# Patient Record
Sex: Female | Born: 1950 | Race: White | Hispanic: No | Marital: Married | Smoking: Never smoker
Health system: Southern US, Community
[De-identification: ages and names within clinical notes are randomized; demographics above are authoritative.]

## PROBLEM LIST (undated history)

## (undated) DIAGNOSIS — Z9889 Other specified postprocedural states: Secondary | ICD-10-CM

## (undated) DIAGNOSIS — I493 Ventricular premature depolarization: Secondary | ICD-10-CM

## (undated) DIAGNOSIS — Z87442 Personal history of urinary calculi: Secondary | ICD-10-CM

## (undated) DIAGNOSIS — F32A Depression, unspecified: Secondary | ICD-10-CM

## (undated) DIAGNOSIS — Z8041 Family history of malignant neoplasm of ovary: Secondary | ICD-10-CM

## (undated) DIAGNOSIS — Z800 Family history of malignant neoplasm of digestive organs: Secondary | ICD-10-CM

## (undated) DIAGNOSIS — R112 Nausea with vomiting, unspecified: Secondary | ICD-10-CM

## (undated) DIAGNOSIS — K219 Gastro-esophageal reflux disease without esophagitis: Secondary | ICD-10-CM

## (undated) DIAGNOSIS — K589 Irritable bowel syndrome without diarrhea: Secondary | ICD-10-CM

## (undated) DIAGNOSIS — F329 Major depressive disorder, single episode, unspecified: Secondary | ICD-10-CM

## (undated) DIAGNOSIS — M810 Age-related osteoporosis without current pathological fracture: Secondary | ICD-10-CM

## (undated) DIAGNOSIS — E119 Type 2 diabetes mellitus without complications: Secondary | ICD-10-CM

## (undated) DIAGNOSIS — E785 Hyperlipidemia, unspecified: Secondary | ICD-10-CM

## (undated) DIAGNOSIS — Z8042 Family history of malignant neoplasm of prostate: Secondary | ICD-10-CM

## (undated) DIAGNOSIS — M1990 Unspecified osteoarthritis, unspecified site: Secondary | ICD-10-CM

## (undated) DIAGNOSIS — Z8481 Family history of carrier of genetic disease: Secondary | ICD-10-CM

## (undated) DIAGNOSIS — Z803 Family history of malignant neoplasm of breast: Secondary | ICD-10-CM

## (undated) HISTORY — PX: KNEE ARTHROSCOPY: SUR90

## (undated) HISTORY — DX: Family history of malignant neoplasm of digestive organs: Z80.0

## (undated) HISTORY — DX: Family history of carrier of genetic disease: Z84.81

## (undated) HISTORY — DX: Unspecified osteoarthritis, unspecified site: M19.90

## (undated) HISTORY — DX: Type 2 diabetes mellitus without complications: E11.9

## (undated) HISTORY — DX: Irritable bowel syndrome, unspecified: K58.9

## (undated) HISTORY — DX: Family history of malignant neoplasm of prostate: Z80.42

## (undated) HISTORY — PX: COLONOSCOPY: SHX174

## (undated) HISTORY — DX: Hyperlipidemia, unspecified: E78.5

## (undated) HISTORY — DX: Age-related osteoporosis without current pathological fracture: M81.0

## (undated) HISTORY — PX: ABDOMINAL HYSTERECTOMY: SHX81

## (undated) HISTORY — PX: TONSILLECTOMY: SHX5217

## (undated) HISTORY — DX: Gastro-esophageal reflux disease without esophagitis: K21.9

## (undated) HISTORY — DX: Ventricular premature depolarization: I49.3

## (undated) HISTORY — DX: Family history of malignant neoplasm of breast: Z80.3

## (undated) HISTORY — PX: REDUCTION MAMMAPLASTY: SUR839

## (undated) HISTORY — DX: Family history of malignant neoplasm of ovary: Z80.41

---

## 1998-02-27 ENCOUNTER — Ambulatory Visit (HOSPITAL_BASED_OUTPATIENT_CLINIC_OR_DEPARTMENT_OTHER): Admission: RE | Admit: 1998-02-27 | Discharge: 1998-02-27 | Payer: Self-pay | Admitting: Plastic Surgery

## 1998-09-29 HISTORY — PX: BREAST REDUCTION SURGERY: SHX8

## 1999-07-25 ENCOUNTER — Encounter: Payer: Self-pay | Admitting: Obstetrics and Gynecology

## 1999-07-25 ENCOUNTER — Encounter: Admission: RE | Admit: 1999-07-25 | Discharge: 1999-07-25 | Payer: Self-pay | Admitting: Obstetrics and Gynecology

## 1999-09-18 ENCOUNTER — Emergency Department (HOSPITAL_COMMUNITY): Admission: EM | Admit: 1999-09-18 | Discharge: 1999-09-18 | Payer: Self-pay | Admitting: Emergency Medicine

## 1999-09-18 ENCOUNTER — Encounter: Payer: Self-pay | Admitting: Internal Medicine

## 1999-11-07 ENCOUNTER — Ambulatory Visit (HOSPITAL_COMMUNITY): Admission: RE | Admit: 1999-11-07 | Discharge: 1999-11-07 | Payer: Self-pay | Admitting: Internal Medicine

## 1999-12-27 ENCOUNTER — Encounter (INDEPENDENT_AMBULATORY_CARE_PROVIDER_SITE_OTHER): Payer: Self-pay | Admitting: Specialist

## 1999-12-27 ENCOUNTER — Other Ambulatory Visit: Admission: RE | Admit: 1999-12-27 | Discharge: 1999-12-27 | Payer: Self-pay | Admitting: Gastroenterology

## 2000-04-30 ENCOUNTER — Encounter: Admission: RE | Admit: 2000-04-30 | Discharge: 2000-04-30 | Payer: Self-pay | Admitting: Obstetrics and Gynecology

## 2000-04-30 ENCOUNTER — Encounter: Payer: Self-pay | Admitting: Obstetrics and Gynecology

## 2001-04-19 ENCOUNTER — Encounter (INDEPENDENT_AMBULATORY_CARE_PROVIDER_SITE_OTHER): Payer: Self-pay

## 2001-04-19 ENCOUNTER — Inpatient Hospital Stay (HOSPITAL_COMMUNITY): Admission: RE | Admit: 2001-04-19 | Discharge: 2001-04-22 | Payer: Self-pay | Admitting: Obstetrics and Gynecology

## 2001-05-18 ENCOUNTER — Encounter: Admission: RE | Admit: 2001-05-18 | Discharge: 2001-05-18 | Payer: Self-pay | Admitting: Obstetrics and Gynecology

## 2001-05-18 ENCOUNTER — Encounter: Payer: Self-pay | Admitting: Obstetrics and Gynecology

## 2002-06-21 ENCOUNTER — Encounter: Payer: Self-pay | Admitting: Obstetrics and Gynecology

## 2002-06-21 ENCOUNTER — Encounter: Admission: RE | Admit: 2002-06-21 | Discharge: 2002-06-21 | Payer: Self-pay | Admitting: Internal Medicine

## 2002-06-21 ENCOUNTER — Encounter: Admission: RE | Admit: 2002-06-21 | Discharge: 2002-06-21 | Payer: Self-pay | Admitting: Obstetrics and Gynecology

## 2002-06-21 ENCOUNTER — Encounter: Payer: Self-pay | Admitting: Internal Medicine

## 2002-09-14 ENCOUNTER — Encounter: Payer: Self-pay | Admitting: Internal Medicine

## 2002-09-14 ENCOUNTER — Encounter: Admission: RE | Admit: 2002-09-14 | Discharge: 2002-09-14 | Payer: Self-pay | Admitting: Internal Medicine

## 2003-04-12 ENCOUNTER — Encounter: Admission: RE | Admit: 2003-04-12 | Discharge: 2003-04-12 | Payer: Self-pay | Admitting: Internal Medicine

## 2003-04-12 ENCOUNTER — Encounter: Payer: Self-pay | Admitting: Internal Medicine

## 2003-05-01 ENCOUNTER — Encounter: Payer: Self-pay | Admitting: Internal Medicine

## 2003-05-01 ENCOUNTER — Encounter: Admission: RE | Admit: 2003-05-01 | Discharge: 2003-05-01 | Payer: Self-pay | Admitting: Internal Medicine

## 2003-06-29 ENCOUNTER — Encounter: Payer: Self-pay | Admitting: Internal Medicine

## 2003-06-29 ENCOUNTER — Encounter: Admission: RE | Admit: 2003-06-29 | Discharge: 2003-06-29 | Payer: Self-pay | Admitting: Internal Medicine

## 2004-08-08 ENCOUNTER — Encounter: Admission: RE | Admit: 2004-08-08 | Discharge: 2004-08-08 | Payer: Self-pay | Admitting: Internal Medicine

## 2004-09-18 ENCOUNTER — Emergency Department (HOSPITAL_COMMUNITY): Admission: EM | Admit: 2004-09-18 | Discharge: 2004-09-18 | Payer: Self-pay | Admitting: Emergency Medicine

## 2005-08-05 ENCOUNTER — Ambulatory Visit: Payer: Self-pay | Admitting: Gastroenterology

## 2005-08-06 ENCOUNTER — Encounter (INDEPENDENT_AMBULATORY_CARE_PROVIDER_SITE_OTHER): Payer: Self-pay | Admitting: *Deleted

## 2005-08-06 ENCOUNTER — Ambulatory Visit: Payer: Self-pay | Admitting: Gastroenterology

## 2005-08-11 ENCOUNTER — Encounter: Admission: RE | Admit: 2005-08-11 | Discharge: 2005-08-11 | Payer: Self-pay | Admitting: Internal Medicine

## 2006-08-13 ENCOUNTER — Encounter: Admission: RE | Admit: 2006-08-13 | Discharge: 2006-08-13 | Payer: Self-pay | Admitting: Internal Medicine

## 2007-08-16 ENCOUNTER — Encounter: Admission: RE | Admit: 2007-08-16 | Discharge: 2007-08-16 | Payer: Self-pay | Admitting: Internal Medicine

## 2008-08-16 ENCOUNTER — Encounter: Admission: RE | Admit: 2008-08-16 | Discharge: 2008-08-16 | Payer: Self-pay | Admitting: Internal Medicine

## 2009-08-20 ENCOUNTER — Encounter: Admission: RE | Admit: 2009-08-20 | Discharge: 2009-08-20 | Payer: Self-pay | Admitting: Internal Medicine

## 2010-08-19 ENCOUNTER — Encounter: Admission: RE | Admit: 2010-08-19 | Discharge: 2010-08-19 | Payer: Self-pay | Admitting: Internal Medicine

## 2010-09-29 HISTORY — PX: CARPAL TUNNEL RELEASE: SHX101

## 2010-10-19 ENCOUNTER — Encounter: Payer: Self-pay | Admitting: Internal Medicine

## 2011-01-15 ENCOUNTER — Encounter (HOSPITAL_BASED_OUTPATIENT_CLINIC_OR_DEPARTMENT_OTHER)
Admission: RE | Admit: 2011-01-15 | Discharge: 2011-01-15 | Disposition: A | Discharge status: Home / Self Care | Payer: Managed Care, Other (non HMO) | Source: Ambulatory Visit | Attending: Orthopedic Surgery | Admitting: Orthopedic Surgery

## 2011-01-16 ENCOUNTER — Ambulatory Visit (HOSPITAL_BASED_OUTPATIENT_CLINIC_OR_DEPARTMENT_OTHER)
Admission: RE | Admit: 2011-01-16 | Discharge: 2011-01-16 | Disposition: A | Discharge status: Home / Self Care | Payer: Managed Care, Other (non HMO) | Source: Ambulatory Visit | Attending: Orthopedic Surgery | Admitting: Orthopedic Surgery

## 2011-01-16 DIAGNOSIS — Z01812 Encounter for preprocedural laboratory examination: Secondary | ICD-10-CM | POA: Insufficient documentation

## 2011-01-16 DIAGNOSIS — G5600 Carpal tunnel syndrome, unspecified upper limb: Secondary | ICD-10-CM | POA: Insufficient documentation

## 2011-01-16 DIAGNOSIS — F329 Major depressive disorder, single episode, unspecified: Secondary | ICD-10-CM | POA: Insufficient documentation

## 2011-01-16 DIAGNOSIS — F3289 Other specified depressive episodes: Secondary | ICD-10-CM | POA: Insufficient documentation

## 2011-01-16 LAB — POCT HEMOGLOBIN-HEMACUE: Hemoglobin: 14.9 g/dL (ref 12.0–15.0)

## 2011-01-21 NOTE — Op Note (Signed)
NAME:  Carol Bell, Carol Bell                  ACCOUNT NO.:  [id number]  MEDICAL RECORD NO.:  [id number]          PATIENT TYPE:  LOCATION:                                 FACILITY:  PHYSICIAN:  Katy Fitch. Ab Leaming, M.D.      DATE OF BIRTH:  DATE OF PROCEDURE:  01/16/2011 DATE OF DISCHARGE:                              OPERATIVE REPORT   PREOPERATIVE DIAGNOSIS:  Chronic right carpal tunnel syndrome with positive electrodiagnostic studies demonstrated moderately severe neuropathy.  POSTOPERATIVE DIAGNOSIS:  Chronic right carpal tunnel syndrome with positive electrodiagnostic studies demonstrated moderately severe neuropathy.  OPERATIONS:  Release of right transverse carpal ligament.  OPERATIONS:  Katy Fitch. Jennie Hannay, MD  ASSISTANT:  Annye Rusk PA-C.  ANESTHESIA:  General by LMA.  SUPERVISING ANESTHESIOLOGIST:  Burna Forts, MD  INDICATIONS:  [name] is a 60 year old woman who referred for evaluation and management of hand numbness.  Clinical examination suggested right carpal tunnel syndrome.  Electrodiagnostic confirmed moderately severe right carpal tunnel syndrome with marked prolongation of the motor and sensory latencies.  Due to failed respond to nonoperative measures, she is brought to the operating room at this time for release of her right transverse carpal ligament.  Preoperatively, questions were invited and answered in detail.  PROCEDURE:  Lue Sykora was brought to room 1 of the [facility] and placed in supine position on the operating table.  Following induction of general anesthesia by LMA technique under Dr. Marlane Mingle direct supervision, the right arm was prepped with Betadine soap and solution, sterilely draped.  A pneumatic tourniquet was applied to the proximal right brachium.  Following exsanguination of the right arm with Esmarch bandage, arterial tourniquet was inflated to 220 mmHg.  After routine surgical time-out, procedure commenced  with a short incision in line of the ring finger and the palm.  Subcutaneous tissues were carefully divided revealing the palmar fascia.  This was split longitudinally to reveal the common sensory branch of the median nerve. These were followed back to the transverse carpal ligament, which was gently isolated from the median nerve and ulnar bursa with a Insurance risk surveyor.  Once pathway was created deep to the ligament extending into the distal forearm, scissors were used to release the ligament subcutaneously into the distal forearm.  There was an accessory volar carpal ligament noted at the distal wrist flexion crease that was released with a Therapist, nutritional.  The ulnar bursa was fibrotic and thickened.  No mass or other predicaments were noted.  Bleeding points along the margin of the released ligament were electrocauterized with bipolar current followed by repair of the skin with intradermal 3-0 Prolene suture.  A compressive dressing was applied with a volar plaster splint maintaining the wrist in 5 degrees of dorsiflexion.  For aftercare, Ms. Norment is provided prescription for Percocet 5 mg 1 p.o. q.4-6 h p.r.n. pain, 20 tablets without refill.  We have asked to return for followup in our office in 8 days for dressing change and suture removal.     Katy Fitch. Jovian Lembcke, M.D.     RVS/MEDQ  D:  01/16/2011  T:  01/16/2011  Job:  [id number]  Electronically Signed by Josephine Igo M.D. on 01/21/2011 11:59:50 AM

## 2011-02-14 NOTE — Op Note (Signed)
[facility]  Patient:    Carol Bell, Carol Bell [facility]                       MRN: [id number] Proc. Date: 04/19/01 Adm. Date:  [id number] Attending:  Lendon Colonel                           Operative Report  PREOPERATIVE DIAGNOSES:  Pelvic pain, perfuse periods unresponsive to conservative measures.  POSTOPERATIVE DIAGNOSES:  History of focal endometriosis, small uterine fibroids.  OPERATION PERFORMED:  Total abdominal hysterectomy, bilateral salpingo-oophorectomy.  DESCRIPTION OF PROCEDURE:  The patient was placed in lithotomy position, prepped and draped in the usual fashion. A transverse incision was made in the lower abdomen and after prep and draping a Foley catheter was inserted. The abdomen was entered through the Pfannenstiel incision. Hemostasis was accomplished with the Bovie. Exploration of the upper abdomen revealed a smooth liver and gallbladder. The right kidney was a little bit more irregular than the left but an IVP has been said to be normal. There was on periaortic adenopathy.  The uterus was irregular and had a subserosal fibroid and was soft and boggy consistent with adenomyosis. Both ovaries were normal. The infundibulopelvic ligaments on each side were isolated and ligated with #0 chromic suture as well as the round ligaments on each side. The uterine vessels were skeletonized and ligated. The cardinals and uterosacral ligaments were clamped and ligated with curved Heaney clamps. The angles of the vagina were entered and the specimen was removed from the operative field. The vagina was closed vertically with transverse mattress sutures of #0 chromic and #0 Vicryl. Hemostasis was secured, reperitonealization was accomplished. The uterosacral ligaments were then plicated in the midline for vault support. No unusual blood loss occurred. There was some oozing from both uterosacral ligaments and these were sutured with #0 Vicryl suture.  Reperitonealization accomplished with 3-0 Vicryl. The parietoperitoneum was closed with 2-0 PDS. The fascia was closed with 2-0 PDS and interrupted 2-0 PDS, subcutaneum was closed with one 2-0 PDS and the skin was closed with 4-0 subcuticular PDS. The incision was infiltrated 0.5% Marcaine. The patient tolerated the procedure well. Estimated blood loss was 100-200 cc. DD:  04/19/01 TD:  04/19/01 Job: [id number]

## 2011-02-14 NOTE — Discharge Summary (Signed)
[facility]  Patient:    NELWYN, HEBDON [facility]                       MRN: [id number] Adm. Date:  [id number] Disc. Date: [id number] Attending:  Lendon Colonel                           Discharge Summary  ADMITTING DIAGNOSIS:  Continued pelvic pain.  DISCHARGE DIAGNOSIS:  Continued pelvic pain.  OPERATION:  Total abdominal hysterectomy/bilateral salpingo-oophorectomy.  HISTORY OF PRESENT ILLNESS:  Ms. Bieker is a 60 year old, gravida 2 female who had been followed for lower abdominal pain.  She had a laparoscopy in 1995 with the findings of minimal pelvic endometriosis.  She was treated conservatively with a laser and she has since been on an oral contraceptive therapy for menses control.  However, she continues to have diffuse lower abdominal pain and desires with proceeding with hysterectomy.  She has been evaluated by the urologist for interstitial cystitis and felt to be completely clear in that regard.  Admission lab studies consisted of a hemoglobin and hematocrit.  Hemoglobin was 12.6, hematocrit was 37.  Cardiogram showed a left axis deviation, otherwise within normal limits.  HOSPITAL COURSE:  Patient was admitted to the hospital and underwent an uneventful TAH-BSO.  Pathology report confirmed minimal endometriosis and small uterine fibroids.  Her postoperative course was uncomplicated except for the fact that she was unable to empty her bladder.  Finally, she was kept in the hospital overnight and appeared to be voiding better the next day and was discharged on July 25.  She was asked to return to the office in two weeks. She was given Percocet for pain and hormone replacement therapy.  CONDITION ON DISCHARGE:  Improved. DD:  04/29/01 TD:  04/29/01 Job: [id number]

## 2011-02-14 NOTE — H&P (Signed)
[facility]  Patient:    Carol Bell, Carol Bell                           MRN: [id number] Adm. Date:  04/19/01 Attending:  Katherine Roan, M.D.                         History and Physical  CHIEF COMPLAINT:  Pelvic pain.  HISTORY OF PRESENT ILLNESS:  Ms. Wike is a 60 year old gravida 2, para 2 female who has been followed for low abdominal pain.  She had a laparoscopy in 1995 with findings of minimal pelvic endometriosis.  This was treated conservatively with a laser and she has been followed.  She has been given Ortho-Cyclen for menstrual control, however, she continues to have diffuse lower abdominal pain and is desirous of proceeding with hysterectomy. Prior to that, I have had her evaluated by urologist to rule out interstitial cystitis and that has been done twice and no evidence of endometriosis; she was also cystoscoped at the time of laparoscopy in 1995.  I have reviewed her situation with her husband.  Hysterectomy with bilateral salpingo-oophorectomy are planned.  They understand that no guaranteed relief for pain is made at this time.  CURRENT MEDICATIONS:  Yasmin for menstrual control.  ALLERGIES:  She has no known allergies.  PAST MEDICAL HISTORY:  She has had a history of breast reduction in 1999.  REVIEW OF SYSTEMS:  She wears glasses but has noted no decrease in recent visual or auditory acuity.  No dizziness.  No headaches.  No frequent sore throats.  HEART:  She has no history of hypertension or rheumatic fever.  No chest pain.  No history of mitral valve prolapse.  LUNGS:  No chronic cough, no weight loss, no hemoptysis or asthma.  GU:  She specifically denies incontinence but has been evaluated urologically x 2.  She denies any frequent UTIs.  Her only other problem is intermittent hematuria.  Cystoscopy and upper tracts have been performed and are normal.  GI:  She has no bowel habit change, no weight loss or gain, no diarrhea, no history  of food intolerance. No history of irritable bowel syndrome.  MUSCLES, BONES AND JOINTS:  No fractures or arthritis.  SOCIAL HISTORY:  Works at [employer].  FAMILY HISTORY:  Her mother is 11 and has Alzheimers and is diabetic.  Father died at age 60 from cancer.  She had a sister with cancer of the breast.  Both parents were diabetic.  PHYSICAL EXAMINATION:  VITAL SIGNS:  Examination revealed a weight of 161 pounds, a blood pressure of 120/80.  GENERAL:  Well-developed, nourished female in no acute distress who appears to be her stated age.  HEENT:  Oropharynx is not injected.  The pupils are round and regular and react to light and accommodation.  NECK:  Supple.  Thyroid is not enlarged.  Carotid pulses are equal without bruits.  No adenopathy appreciated.  BREASTS:  No masses or tenderness.  Axillary areas are free from adenopathy.  LUNGS:  Clear to P&A.  Diaphragms move well with inspiration and expiration.  HEART:  Normal sinus rhythm.  No murmurs.  No heaves, thrills, rubs or gallops.  ABDOMEN:  Soft and flat.  Liver, spleen and kidneys are not palpated.  Bowel sounds appear to be normal without bruits.  No guarding.  Liver, spleen and kidneys are not enlarged.  EXTREMITIES:  Good range of motion, equal pulses are reflexes.  PELVIC:  Examination reveals a clean cervix.  Uterus is anterior.  Adnexa negative.  There is tenderness in each adnexa.  IMPRESSION:  Diffuse pelvic pain, unresponsive to conservative therapy.  PLAN:  TAH, BSO.  Detailed informed consent has been given to this patient. DD: 04/17/01 TD:  04/19/01 Job: [id number]

## 2011-07-01 ENCOUNTER — Other Ambulatory Visit: Payer: Self-pay | Admitting: Internal Medicine

## 2011-07-01 DIAGNOSIS — Z1231 Encounter for screening mammogram for malignant neoplasm of breast: Secondary | ICD-10-CM

## 2011-08-25 ENCOUNTER — Other Ambulatory Visit: Payer: Self-pay | Admitting: Internal Medicine

## 2011-08-25 ENCOUNTER — Ambulatory Visit
Admission: RE | Admit: 2011-08-25 | Discharge: 2011-08-25 | Disposition: A | Discharge status: Home / Self Care | Payer: Managed Care, Other (non HMO) | Source: Ambulatory Visit | Attending: Internal Medicine | Admitting: Internal Medicine

## 2011-08-25 DIAGNOSIS — N63 Unspecified lump in unspecified breast: Secondary | ICD-10-CM

## 2011-08-25 DIAGNOSIS — Z1231 Encounter for screening mammogram for malignant neoplasm of breast: Secondary | ICD-10-CM

## 2011-09-01 ENCOUNTER — Ambulatory Visit
Admission: RE | Admit: 2011-09-01 | Discharge: 2011-09-01 | Disposition: A | Discharge status: Home / Self Care | Payer: Managed Care, Other (non HMO) | Source: Ambulatory Visit | Attending: Internal Medicine | Admitting: Internal Medicine

## 2011-09-01 ENCOUNTER — Other Ambulatory Visit: Payer: Self-pay | Admitting: Internal Medicine

## 2011-09-01 DIAGNOSIS — N63 Unspecified lump in unspecified breast: Secondary | ICD-10-CM

## 2011-11-18 ENCOUNTER — Emergency Department (HOSPITAL_COMMUNITY)
Admission: EM | Admit: 2011-11-18 | Discharge: 2011-11-18 | Disposition: A | Discharge status: Home / Self Care | Payer: Managed Care, Other (non HMO) | Source: Home / Self Care | Attending: Emergency Medicine | Admitting: Emergency Medicine

## 2011-11-18 ENCOUNTER — Encounter (HOSPITAL_COMMUNITY): Payer: Self-pay | Admitting: Emergency Medicine

## 2011-11-18 DIAGNOSIS — J019 Acute sinusitis, unspecified: Secondary | ICD-10-CM

## 2011-11-18 HISTORY — DX: Depression, unspecified: F32.A

## 2011-11-18 HISTORY — DX: Major depressive disorder, single episode, unspecified: F32.9

## 2011-11-18 MED ORDER — AMOXICILLIN-POT CLAVULANATE 875-125 MG PO TABS: 1.0000 | ORAL_TABLET | Freq: Two times a day (BID) | ORAL | Status: AC

## 2011-11-18 NOTE — ED Notes (Signed)
C/o uri symptoms onset Friday and have progressively worsened since then.  C/o headache, ears and throat hurt intermittently.  C/o runny nose, cough, and denies fever

## 2011-11-18 NOTE — ED Notes (Signed)
Escorted patient from waiting room to treatment room

## 2011-11-18 NOTE — ED Provider Notes (Signed)
History     CSN: [id number]  Arrival date & time 11/18/11  1121   First MD Initiated Contact with Patient 11/18/11 1314      Chief Complaint  Patient presents with  . URI    (Consider location/radiation/quality/duration/timing/severity/associated sxs/prior treatment) Patient is a 61 y.o. female presenting with URI. The history is provided by the patient. A language interpreter was used.  URI The primary symptoms include ear pain, sore throat and cough. The current episode started today. This is a new problem.  Symptoms associated with the illness include congestion and rhinorrhea. The illness is not associated with chills.  Pt complains of a sorethroat.  Pt reports she has had a headache and sinus congestion.  Pt reports this feels like sinus infections she has had in the past  Past Medical History  Diagnosis Date  . Depression     Past Surgical History  Procedure Date  . Abdominal hysterectomy   . Breast reduction surgery 2000  . Carpal tunnel release 2012    No family history on file.  History  Substance Use Topics  . Smoking status: Never Smoker   . Smokeless tobacco: Not on file  . Alcohol Use: No    OB History    Grav Para Term Preterm Abortions TAB SAB Ect Mult Living                  Review of Systems  Constitutional: Negative for chills.  HENT: Positive for ear pain, congestion, sore throat and rhinorrhea.   Respiratory: Positive for cough.   All other systems reviewed and are negative.    Allergies  Codeine  Home Medications   Current Outpatient Rx  Name Route Sig Dispense Refill  . BUPROPION HCL 100 MG PO TABS Oral Take 300 mg by mouth once.    Marland Kitchen ALKA-SELTZER PLUS COLD PO Oral Take by mouth.    Marland Kitchen EQUATE PO Oral Take by mouth.    . NYQUIL PO Oral Take by mouth.      BP 122/76  Pulse 82  Temp(Src) 97.8 F (36.6 C) (Oral)  Resp 20  SpO2 97%  Physical Exam  Vitals reviewed. Constitutional: She appears well-developed and  well-nourished.  HENT:  Head: Normocephalic and atraumatic.  Right Ear: External ear normal.  Left Ear: External ear normal.       Throat erythematous,  Tender bilat max sinuses  Eyes: Conjunctivae and EOM are normal. Pupils are equal, round, and reactive to light.  Neck: Normal range of motion. Neck supple.  Cardiovascular: Normal rate.   Pulmonary/Chest: Effort normal.  Abdominal: Soft.  Musculoskeletal: Normal range of motion.  Neurological: She is alert.  Skin: Skin is warm.  Psychiatric: She has a normal mood and affect.    ED Course  Procedures (including critical care time)  Labs Reviewed - No data to display No results found.   No diagnosis found.    MDM  rx for augementin,  Follow up with Dr. Neva Seat next week if not improved        Langston Masker, [place] 11/18/11 1331

## 2011-11-18 NOTE — Discharge Instructions (Signed)

## 2011-11-18 NOTE — ED Provider Notes (Signed)
Medical screening examination/treatment/procedure(s) were performed by non-physician practitioner and as supervising physician I was immediately available for consultation/collaboration.  Raynald Blend, MD 11/18/11 1431

## 2012-01-23 ENCOUNTER — Other Ambulatory Visit: Payer: Self-pay | Admitting: Internal Medicine

## 2012-01-23 DIAGNOSIS — N6009 Solitary cyst of unspecified breast: Secondary | ICD-10-CM

## 2012-03-02 ENCOUNTER — Ambulatory Visit
Admission: RE | Admit: 2012-03-02 | Discharge: 2012-03-02 | Disposition: A | Discharge status: Home / Self Care | Payer: Managed Care, Other (non HMO) | Source: Ambulatory Visit | Attending: Internal Medicine | Admitting: Internal Medicine

## 2012-03-02 DIAGNOSIS — N6009 Solitary cyst of unspecified breast: Secondary | ICD-10-CM

## 2012-07-28 ENCOUNTER — Other Ambulatory Visit: Payer: Self-pay | Admitting: Internal Medicine

## 2012-07-28 DIAGNOSIS — N6320 Unspecified lump in the left breast, unspecified quadrant: Secondary | ICD-10-CM

## 2012-08-11 ENCOUNTER — Other Ambulatory Visit: Payer: Self-pay | Admitting: Internal Medicine

## 2012-08-11 ENCOUNTER — Ambulatory Visit
Admission: RE | Admit: 2012-08-11 | Discharge: 2012-08-11 | Disposition: A | Discharge status: Home / Self Care | Payer: Managed Care, Other (non HMO) | Source: Ambulatory Visit | Attending: Internal Medicine | Admitting: Internal Medicine

## 2012-08-11 DIAGNOSIS — R079 Chest pain, unspecified: Secondary | ICD-10-CM

## 2012-09-02 ENCOUNTER — Ambulatory Visit
Admission: RE | Admit: 2012-09-02 | Discharge: 2012-09-02 | Disposition: A | Discharge status: Home / Self Care | Payer: Managed Care, Other (non HMO) | Source: Ambulatory Visit | Attending: Internal Medicine | Admitting: Internal Medicine

## 2012-09-02 ENCOUNTER — Other Ambulatory Visit: Payer: Self-pay | Admitting: Internal Medicine

## 2012-09-02 DIAGNOSIS — N6320 Unspecified lump in the left breast, unspecified quadrant: Secondary | ICD-10-CM

## 2012-09-03 ENCOUNTER — Ambulatory Visit (HOSPITAL_COMMUNITY)
Admission: RE | Admit: 2012-09-03 | Discharge: 2012-09-03 | Disposition: A | Discharge status: Home / Self Care | Payer: Managed Care, Other (non HMO) | Source: Ambulatory Visit | Attending: Internal Medicine | Admitting: Internal Medicine

## 2012-09-03 DIAGNOSIS — R0789 Other chest pain: Secondary | ICD-10-CM | POA: Insufficient documentation

## 2012-09-03 DIAGNOSIS — R6884 Jaw pain: Secondary | ICD-10-CM | POA: Insufficient documentation

## 2012-09-03 DIAGNOSIS — M79609 Pain in unspecified limb: Secondary | ICD-10-CM | POA: Insufficient documentation

## 2012-09-06 ENCOUNTER — Ambulatory Visit
Admission: RE | Admit: 2012-09-06 | Discharge: 2012-09-06 | Disposition: A | Discharge status: Home / Self Care | Payer: Managed Care, Other (non HMO) | Source: Ambulatory Visit | Attending: Internal Medicine | Admitting: Internal Medicine

## 2012-09-06 ENCOUNTER — Other Ambulatory Visit: Payer: Self-pay | Admitting: Internal Medicine

## 2012-09-06 DIAGNOSIS — N6320 Unspecified lump in the left breast, unspecified quadrant: Secondary | ICD-10-CM

## 2012-09-13 ENCOUNTER — Other Ambulatory Visit: Payer: Managed Care, Other (non HMO)

## 2012-10-28 ENCOUNTER — Encounter: Payer: Self-pay | Admitting: Gastroenterology

## 2012-11-01 ENCOUNTER — Encounter: Payer: Self-pay | Admitting: Gastroenterology

## 2012-12-01 ENCOUNTER — Ambulatory Visit (AMBULATORY_SURGERY_CENTER): Payer: Managed Care, Other (non HMO) | Admitting: *Deleted

## 2012-12-01 ENCOUNTER — Encounter: Payer: Self-pay | Admitting: Gastroenterology

## 2012-12-01 VITALS — Ht 69.0 in | Wt 168.6 lb

## 2012-12-01 DIAGNOSIS — Z1211 Encounter for screening for malignant neoplasm of colon: Secondary | ICD-10-CM

## 2012-12-01 MED ORDER — NA SULFATE-K SULFATE-MG SULF 17.5-3.13-1.6 GM/177ML PO SOLN: ORAL | Status: DC

## 2012-12-14 ENCOUNTER — Ambulatory Visit (AMBULATORY_SURGERY_CENTER): Payer: Managed Care, Other (non HMO) | Admitting: Gastroenterology

## 2012-12-14 ENCOUNTER — Encounter: Payer: Self-pay | Admitting: Gastroenterology

## 2012-12-14 VITALS — BP 130/84 | HR 67 | Temp 97.6°F | Resp 15 | Ht 69.0 in | Wt 165.0 lb

## 2012-12-14 MED ORDER — SODIUM CHLORIDE 0.9 % IV SOLN: 500.0000 mL | INTRAVENOUS | Status: DC

## 2012-12-14 NOTE — Op Note (Addendum)
[facility] [address] [place], [address]   COLONOSCOPY PROCEDURE REPORT  PATIENT: Carol Bell, Carol Bell  MR#: [id number] BIRTHDATE: 11/28/1950 , 61  yrs. old GENDER: Female ENDOSCOPIST: Louis Meckel, MD REFERRED XB:JYNWG Chilton Si, M.D. PROCEDURE DATE:  12/14/2012 PROCEDURE:   Colonoscopy, screening ASA CLASS:   Class II INDICATIONS:average risk screening. MEDICATIONS: MAC sedation, administered by CRNA and propofol (Diprivan) 250mg  IV  DESCRIPTION OF PROCEDURE:   After the risks benefits and alternatives of the procedure were thoroughly explained, informed consent was obtained.  A digital rectal exam revealed no abnormalities of the rectum.   The LB [id number]-[id number]  endoscope was introduced through the anus and advanced to the cecum, which was identified by both the appendix and ileocecal valve. No adverse events experienced.   The quality of the prep was Suprep excellent The instrument was then slowly withdrawn as the colon was fully examined.      COLON FINDINGS: A normal appearing cecum, ileocecal valve, and appendiceal orifice were identified.  The ascending, hepatic flexure, transverse, splenic flexure, descending, sigmoid colon and rectum appeared unremarkable.  No polyps or cancers were seen. Retroflexed views revealed no abnormalities. The time to cecum=7 minutes 50 seconds.  Withdrawal time=7 minutes 30 seconds.  The scope was withdrawn and the procedure completed. COMPLICATIONS: There were no complications.  ENDOSCOPIC IMPRESSION: Normal colon  RECOMMENDATIONS: Continue current colorectal screening recommendations for "routine risk" patients with a repeat colonoscopy in 10 years.   eSigned:  Louis Meckel, MD 12/14/2012 9:59 AM Revised: 12/14/2012 9:59 AM  cc:

## 2012-12-14 NOTE — Progress Notes (Signed)
Patient did not experience any of the following events: a burn prior to discharge; a fall within the facility; wrong site/side/patient/procedure/implant event; or a hospital transfer or hospital admission upon discharge from the facility. (G8907) Patient did not have preoperative order for IV antibiotic SSI prophylaxis. (G8918)  

## 2012-12-14 NOTE — Patient Instructions (Addendum)
YOU HAD AN ENDOSCOPIC PROCEDURE TODAY AT THE [facility]: Refer to the procedure report that was given to you for any specific questions about what was found during the examination.  If the procedure report does not answer your questions, please call your gastroenterologist to clarify.  If you requested that your care partner not be given the details of your procedure findings, then the procedure report has been included in a sealed envelope for you to review at your convenience later.  YOU SHOULD EXPECT: Some feelings of bloating in the abdomen. Passage of more gas than usual.  Walking can help get rid of the air that was put into your GI tract during the procedure and reduce the bloating. If you had a lower endoscopy (such as a colonoscopy or flexible sigmoidoscopy) you may notice spotting of blood in your stool or on the toilet paper. If you underwent a bowel prep for your procedure, then you may not have a normal bowel movement for a few days.  DIET: Your first meal following the procedure should be a light meal and then it is ok to progress to your normal diet.  A half-sandwich or bowl of soup is an example of a good first meal.  Heavy or fried foods are harder to digest and may make you feel nauseous or bloated.  Likewise meals heavy in dairy and vegetables can cause extra gas to form and this can also increase the bloating.  Drink plenty of fluids but you should avoid alcoholic beverages for 24 hours.  ACTIVITY: Your care partner should take you home directly after the procedure.  You should plan to take it easy, moving slowly for the rest of the day.  You can resume normal activity the day after the procedure however you should NOT DRIVE or use heavy machinery for 24 hours (because of the sedation medicines used during the test).    SYMPTOMS TO REPORT IMMEDIATELY: A gastroenterologist can be reached at any hour.  During normal business hours, 8:30 AM to 5:00 PM Monday through Friday,  call [phone].  After hours and on weekends, please call the GI answering service at [phone] who will take a message and have the physician on call contact you.   Following lower endoscopy (colonoscopy or flexible sigmoidoscopy):  Excessive amounts of blood in the stool  Significant tenderness or worsening of abdominal pains  Swelling of the abdomen that is new, acute  Fever of 100F or higher    FOLLOW UP: If any biopsies were taken you will be contacted by phone or by letter within the next 1-3 weeks.  Call your gastroenterologist if you have not heard about the biopsies in 3 weeks.  Our staff will call the home number listed on your records the next business day following your procedure to check on you and address any questions or concerns that you may have at that time regarding the information given to you following your procedure. This is a courtesy call and so if there is no answer at the home number and we have not heard from you through the emergency physician on call, we will assume that you have returned to your regular daily activities without incident.  SIGNATURES/CONFIDENTIALITY: You and/or your care partner have signed paperwork which will be entered into your electronic medical record.  These signatures attest to the fact that that the information above on your After Visit Summary has been reviewed and is understood.  Full responsibility of the confidentiality   of this discharge information lies with you and/or your care-partner.     

## 2012-12-15 ENCOUNTER — Telehealth: Payer: Self-pay | Admitting: *Deleted

## 2012-12-15 NOTE — Telephone Encounter (Signed)
  Follow up Call-  Call back number 12/14/2012  Post procedure Call Back phone  # [phone]  Permission to leave phone message Yes     Patient questions:  Do you have a fever, pain , or abdominal swelling? no Pain Score  0 *  Have you tolerated food without any problems? yes  Have you been able to return to your normal activities? yes  Do you have any questions about your discharge instructions: Diet   no Medications  no Follow up visit  no  Do you have questions or concerns about your Care? no  Actions: * If pain score is 4 or above: No action needed, pain <4.

## 2013-08-02 ENCOUNTER — Other Ambulatory Visit: Payer: Self-pay

## 2013-08-02 DIAGNOSIS — Z1231 Encounter for screening mammogram for malignant neoplasm of breast: Secondary | ICD-10-CM

## 2013-09-05 ENCOUNTER — Ambulatory Visit
Admission: RE | Admit: 2013-09-05 | Discharge: 2013-09-05 | Disposition: A | Discharge status: Home / Self Care | Payer: 59 | Source: Ambulatory Visit

## 2013-09-05 DIAGNOSIS — Z1231 Encounter for screening mammogram for malignant neoplasm of breast: Secondary | ICD-10-CM

## 2014-05-31 ENCOUNTER — Encounter: Payer: Self-pay | Admitting: Gastroenterology

## 2014-07-28 ENCOUNTER — Other Ambulatory Visit: Payer: Self-pay

## 2014-07-28 DIAGNOSIS — Z1231 Encounter for screening mammogram for malignant neoplasm of breast: Secondary | ICD-10-CM

## 2014-09-06 ENCOUNTER — Ambulatory Visit
Admission: RE | Admit: 2014-09-06 | Discharge: 2014-09-06 | Disposition: A | Discharge status: Home / Self Care | Payer: Private Health Insurance - Indemnity | Source: Ambulatory Visit

## 2014-09-06 DIAGNOSIS — Z1231 Encounter for screening mammogram for malignant neoplasm of breast: Secondary | ICD-10-CM

## 2015-07-30 ENCOUNTER — Other Ambulatory Visit: Payer: Self-pay

## 2015-07-30 DIAGNOSIS — Z1231 Encounter for screening mammogram for malignant neoplasm of breast: Secondary | ICD-10-CM

## 2015-09-10 ENCOUNTER — Ambulatory Visit
Admission: RE | Admit: 2015-09-10 | Discharge: 2015-09-10 | Disposition: A | Discharge status: Home / Self Care | Payer: PPO | Source: Ambulatory Visit

## 2015-09-10 DIAGNOSIS — Z1231 Encounter for screening mammogram for malignant neoplasm of breast: Secondary | ICD-10-CM

## 2015-10-22 DIAGNOSIS — H01021 Squamous blepharitis right upper eyelid: Secondary | ICD-10-CM | POA: Diagnosis not present

## 2015-10-22 DIAGNOSIS — H01024 Squamous blepharitis left upper eyelid: Secondary | ICD-10-CM | POA: Diagnosis not present

## 2015-10-22 DIAGNOSIS — H00021 Hordeolum internum right upper eyelid: Secondary | ICD-10-CM | POA: Diagnosis not present

## 2015-10-22 DIAGNOSIS — H01022 Squamous blepharitis right lower eyelid: Secondary | ICD-10-CM | POA: Diagnosis not present

## 2015-10-22 DIAGNOSIS — H01025 Squamous blepharitis left lower eyelid: Secondary | ICD-10-CM | POA: Diagnosis not present

## 2015-10-29 DIAGNOSIS — H01025 Squamous blepharitis left lower eyelid: Secondary | ICD-10-CM | POA: Diagnosis not present

## 2015-10-29 DIAGNOSIS — H01024 Squamous blepharitis left upper eyelid: Secondary | ICD-10-CM | POA: Diagnosis not present

## 2015-10-29 DIAGNOSIS — H01022 Squamous blepharitis right lower eyelid: Secondary | ICD-10-CM | POA: Diagnosis not present

## 2015-10-29 DIAGNOSIS — H01021 Squamous blepharitis right upper eyelid: Secondary | ICD-10-CM | POA: Diagnosis not present

## 2015-10-29 DIAGNOSIS — H00021 Hordeolum internum right upper eyelid: Secondary | ICD-10-CM | POA: Diagnosis not present

## 2015-12-20 DIAGNOSIS — M25561 Pain in right knee: Secondary | ICD-10-CM | POA: Diagnosis not present

## 2015-12-20 DIAGNOSIS — M25562 Pain in left knee: Secondary | ICD-10-CM | POA: Diagnosis not present

## 2016-01-25 DIAGNOSIS — Z23 Encounter for immunization: Secondary | ICD-10-CM | POA: Diagnosis not present

## 2016-01-25 DIAGNOSIS — M1990 Unspecified osteoarthritis, unspecified site: Secondary | ICD-10-CM | POA: Diagnosis not present

## 2016-03-03 DIAGNOSIS — N390 Urinary tract infection, site not specified: Secondary | ICD-10-CM | POA: Diagnosis not present

## 2016-04-08 DIAGNOSIS — N3090 Cystitis, unspecified without hematuria: Secondary | ICD-10-CM | POA: Diagnosis not present

## 2016-06-17 DIAGNOSIS — Z23 Encounter for immunization: Secondary | ICD-10-CM | POA: Diagnosis not present

## 2016-07-01 DIAGNOSIS — M1990 Unspecified osteoarthritis, unspecified site: Secondary | ICD-10-CM | POA: Diagnosis not present

## 2016-08-11 ENCOUNTER — Other Ambulatory Visit: Payer: Self-pay | Admitting: Internal Medicine

## 2016-08-11 DIAGNOSIS — Z1231 Encounter for screening mammogram for malignant neoplasm of breast: Secondary | ICD-10-CM

## 2016-08-28 DIAGNOSIS — N200 Calculus of kidney: Secondary | ICD-10-CM | POA: Diagnosis not present

## 2016-08-28 DIAGNOSIS — F419 Anxiety disorder, unspecified: Secondary | ICD-10-CM | POA: Diagnosis not present

## 2016-08-28 DIAGNOSIS — E7800 Pure hypercholesterolemia, unspecified: Secondary | ICD-10-CM | POA: Diagnosis not present

## 2016-08-28 DIAGNOSIS — D559 Anemia due to enzyme disorder, unspecified: Secondary | ICD-10-CM | POA: Diagnosis not present

## 2016-08-28 DIAGNOSIS — M1990 Unspecified osteoarthritis, unspecified site: Secondary | ICD-10-CM | POA: Diagnosis not present

## 2016-08-28 DIAGNOSIS — K219 Gastro-esophageal reflux disease without esophagitis: Secondary | ICD-10-CM | POA: Diagnosis not present

## 2016-08-28 DIAGNOSIS — Z0000 Encounter for general adult medical examination without abnormal findings: Secondary | ICD-10-CM | POA: Diagnosis not present

## 2016-09-01 ENCOUNTER — Other Ambulatory Visit: Payer: Self-pay | Admitting: Internal Medicine

## 2016-09-02 ENCOUNTER — Other Ambulatory Visit: Payer: Self-pay | Admitting: Internal Medicine

## 2016-09-02 DIAGNOSIS — M8580 Other specified disorders of bone density and structure, unspecified site: Secondary | ICD-10-CM

## 2016-09-15 ENCOUNTER — Ambulatory Visit
Admission: RE | Admit: 2016-09-15 | Discharge: 2016-09-15 | Disposition: A | Discharge status: Home / Self Care | Payer: PPO | Source: Ambulatory Visit | Attending: Internal Medicine | Admitting: Internal Medicine

## 2016-09-15 DIAGNOSIS — Z780 Asymptomatic menopausal state: Secondary | ICD-10-CM | POA: Diagnosis not present

## 2016-09-15 DIAGNOSIS — M8580 Other specified disorders of bone density and structure, unspecified site: Secondary | ICD-10-CM

## 2016-09-15 DIAGNOSIS — Z1231 Encounter for screening mammogram for malignant neoplasm of breast: Secondary | ICD-10-CM | POA: Diagnosis not present

## 2016-09-15 DIAGNOSIS — M85852 Other specified disorders of bone density and structure, left thigh: Secondary | ICD-10-CM | POA: Diagnosis not present

## 2017-02-09 DIAGNOSIS — J209 Acute bronchitis, unspecified: Secondary | ICD-10-CM | POA: Diagnosis not present

## 2017-02-09 DIAGNOSIS — L509 Urticaria, unspecified: Secondary | ICD-10-CM | POA: Diagnosis not present

## 2017-02-09 DIAGNOSIS — R0902 Hypoxemia: Secondary | ICD-10-CM | POA: Diagnosis not present

## 2017-02-21 DIAGNOSIS — R109 Unspecified abdominal pain: Secondary | ICD-10-CM | POA: Diagnosis not present

## 2017-02-24 ENCOUNTER — Ambulatory Visit
Admission: RE | Admit: 2017-02-24 | Discharge: 2017-02-24 | Disposition: A | Discharge status: Home / Self Care | Payer: PPO | Source: Ambulatory Visit | Attending: Internal Medicine | Admitting: Internal Medicine

## 2017-02-24 ENCOUNTER — Other Ambulatory Visit: Payer: Self-pay | Admitting: Internal Medicine

## 2017-02-24 DIAGNOSIS — K37 Unspecified appendicitis: Secondary | ICD-10-CM | POA: Diagnosis not present

## 2017-02-24 DIAGNOSIS — R1031 Right lower quadrant pain: Secondary | ICD-10-CM

## 2017-02-24 DIAGNOSIS — R109 Unspecified abdominal pain: Secondary | ICD-10-CM | POA: Diagnosis not present

## 2017-02-24 DIAGNOSIS — R0902 Hypoxemia: Secondary | ICD-10-CM | POA: Diagnosis not present

## 2017-02-24 DIAGNOSIS — J069 Acute upper respiratory infection, unspecified: Secondary | ICD-10-CM | POA: Diagnosis not present

## 2017-02-24 MED ORDER — IOPAMIDOL (ISOVUE-300) INJECTION 61%
100.0000 mL | Freq: Once | INTRAVENOUS | Status: AC | PRN
  Administered 2017-02-24: 100 mL via INTRAVENOUS

## 2017-03-09 DIAGNOSIS — J029 Acute pharyngitis, unspecified: Secondary | ICD-10-CM | POA: Diagnosis not present

## 2017-03-16 DIAGNOSIS — J069 Acute upper respiratory infection, unspecified: Secondary | ICD-10-CM | POA: Diagnosis not present

## 2017-03-16 DIAGNOSIS — R0902 Hypoxemia: Secondary | ICD-10-CM | POA: Diagnosis not present

## 2017-04-13 DIAGNOSIS — H10022 Other mucopurulent conjunctivitis, left eye: Secondary | ICD-10-CM | POA: Diagnosis not present

## 2017-04-21 DIAGNOSIS — H10022 Other mucopurulent conjunctivitis, left eye: Secondary | ICD-10-CM | POA: Diagnosis not present

## 2017-06-16 DIAGNOSIS — Z23 Encounter for immunization: Secondary | ICD-10-CM | POA: Diagnosis not present

## 2017-08-06 ENCOUNTER — Other Ambulatory Visit: Payer: Self-pay | Admitting: Internal Medicine

## 2017-08-06 DIAGNOSIS — Z1231 Encounter for screening mammogram for malignant neoplasm of breast: Secondary | ICD-10-CM

## 2017-08-28 DIAGNOSIS — F419 Anxiety disorder, unspecified: Secondary | ICD-10-CM | POA: Diagnosis not present

## 2017-08-28 DIAGNOSIS — Z6841 Body Mass Index (BMI) 40.0 and over, adult: Secondary | ICD-10-CM | POA: Diagnosis not present

## 2017-08-28 DIAGNOSIS — M8580 Other specified disorders of bone density and structure, unspecified site: Secondary | ICD-10-CM | POA: Diagnosis not present

## 2017-08-28 DIAGNOSIS — Z1211 Encounter for screening for malignant neoplasm of colon: Secondary | ICD-10-CM | POA: Diagnosis not present

## 2017-08-28 DIAGNOSIS — Z1331 Encounter for screening for depression: Secondary | ICD-10-CM | POA: Diagnosis not present

## 2017-08-28 DIAGNOSIS — N200 Calculus of kidney: Secondary | ICD-10-CM | POA: Diagnosis not present

## 2017-08-31 ENCOUNTER — Ambulatory Visit (HOSPITAL_BASED_OUTPATIENT_CLINIC_OR_DEPARTMENT_OTHER): Payer: PPO | Admitting: Genetic Counselor

## 2017-08-31 ENCOUNTER — Encounter: Payer: Self-pay | Admitting: Genetic Counselor

## 2017-08-31 ENCOUNTER — Other Ambulatory Visit: Payer: PPO

## 2017-08-31 DIAGNOSIS — Z8042 Family history of malignant neoplasm of prostate: Secondary | ICD-10-CM | POA: Diagnosis not present

## 2017-08-31 DIAGNOSIS — Z800 Family history of malignant neoplasm of digestive organs: Secondary | ICD-10-CM | POA: Diagnosis not present

## 2017-08-31 DIAGNOSIS — Z8041 Family history of malignant neoplasm of ovary: Secondary | ICD-10-CM | POA: Diagnosis not present

## 2017-08-31 DIAGNOSIS — Z8481 Family history of carrier of genetic disease: Secondary | ICD-10-CM | POA: Diagnosis not present

## 2017-08-31 DIAGNOSIS — Z803 Family history of malignant neoplasm of breast: Secondary | ICD-10-CM | POA: Insufficient documentation

## 2017-08-31 NOTE — Progress Notes (Signed)
REFERRING PROVIDER: Levin Erp, MD [address] [place] [address] [place], [place] [address]  PRIMARY PROVIDER:  Levin Erp, MD  PRIMARY REASON FOR VISIT:  1. Family history of breast cancer   2. Family history of prostate cancer   3. Family history of pancreatic cancer   4. Family history of ovarian cancer   5. Family history of BRCA2 gene positive      HISTORY OF PRESENT ILLNESS:   Carol Bell, a 66 y.o. female, was seen for a [place] cancer genetics consultation at the request of Dr. Nyoka Cowden due to a family history of cancer.  Carol Bell presents to clinic today to discuss the possibility of a hereditary predisposition to cancer, genetic testing, and to further clarify her future cancer risks, as well as potential cancer risks for family members. Carol Bell is a 66 y.o. female with no personal history of cancer.  She reports under going genetic testing in the 1980's at a family reunion that was reportedly negative.  This testing was performed by researchers at [place], [place].  The patient's great niece was diagnosed with both breast and ovarian cancer and was found to carry a BRCA2 mutation.  The niece's father, the patient's nephew, was found to have stage 4 prostate cancer at 17 and also has the BRCA2 mutation.  CANCER HISTORY:   No history exists.     HORMONAL RISK FACTORS:  Menarche was at age 67.  First live birth at age 89.  OCP use for approximately 1-2 years.  Ovaries intact: no.  Hysterectomy: yes.  Menopausal status: postmenopausal.  HRT use: 0 years. Colonoscopy: yes; a couple polyps. Mammogram within the last year: yes. Number of breast biopsies: 0. Up to date with pelvic exams:  No pelvic exams due to previous hysterectomy. Any excessive radiation exposure in the past:  no  Past Medical History:  Diagnosis Date  . Depression   . Family history of BRCA2 gene positive   . Family history of breast cancer   . Family history of ovarian cancer   . Family history of  pancreatic cancer   . Family history of prostate cancer     Past Surgical History:  Procedure Laterality Date  . ABDOMINAL HYSTERECTOMY    . BREAST REDUCTION SURGERY  2000  . CARPAL TUNNEL RELEASE  2012  . KNEE ARTHROSCOPY  10-04-02&07-05-04   right    Social History   Socioeconomic History  . Marital status: Married    Spouse name: Not on file  . Number of children: Not on file  . Years of education: Not on file  . Highest education level: Not on file  Social Needs  . Financial resource strain: Not on file  . Food insecurity - worry: Not on file  . Food insecurity - inability: Not on file  . Transportation needs - medical: Not on file  . Transportation needs - non-medical: Not on file  Occupational History  . Not on file  Tobacco Use  . Smoking status: Never Smoker  . Smokeless tobacco: Never Used  Substance and Sexual Activity  . Alcohol use: No  . Drug use: No  . Sexual activity: Not on file  Other Topics Concern  . Not on file  Social History Narrative  . Not on file     FAMILY HISTORY:  We obtained a detailed, 4-generation family history.  Significant diagnoses are listed below: Family History  Problem Relation Age of Onset  . Dementia Mother   . Breast  cancer Father 61  . Prostate cancer Father 90  . Melanoma Father 35  . Breast cancer Sister 15  . Pancreatic cancer Sister   . Diabetes Brother   . Breast cancer Paternal Aunt   . Brain cancer Paternal Uncle   . Kidney disease Maternal Grandmother   . Melanoma Maternal Grandfather   . Diabetes Paternal Grandmother   . Diabetes Paternal Grandfather   . Stomach cancer Paternal Aunt   . Melanoma Paternal Aunt   . Breast cancer Cousin        3 paternal first cousins  . Bladder Cancer Cousin        paternal first cousin  . Prostate cancer Cousin        paternal first cousin  . Prostate cancer Other 84       metastatic prostate cancer  . Breast cancer Other        great niece  . Ovarian cancer Other    . Colon cancer Neg Hx     The patient has two daughters who are cancer free.  One daughter has undergone genetic testing and the other is in the process of setting up an appointment.  The patient has one brother and one sister.  Her sister was diagnosed with breast cancer in her 58's and pancreatic cancer in her late 28's and died at 68.  This sister had two sons, one who has been diagnosed with prostate cancer.  The patient's brother has diabetes, but no cancer.  Both parents are deceased.  The patient's father was diagnosed with melanoma and breast cancer at age 65 and prostate cancer at 66.  He died at 69.  He had two brothers and five sisters.  One sister had breast cancer and has a son who had bladder cancer and a daughter with breast cancer.  Another sister had stomach cancer and she had one son with prostate cancer.  A third sister did not have cancer but she had a daughter who died of breast cancer. A fourth sister had melanoma, and a 5th sister died in a car accident without a history of cancer.  One brother had a brain cancer and the other brother does not have a history of cancer but has a daughter with breast cancer.  The patient's mother died of complications from dementia.  She had five siblings who had heart disease and dementia, but no cancer.  The maternal grandparents are both deceased.  The grandfather had melanoma and the grandmother had kidney disease.  Patient's maternal ancestors are of English descent, and paternal ancestors are of Caucasian descent. There is no reported Ashkenazi Jewish ancestry. There is no known consanguinity.  GENETIC COUNSELING ASSESSMENT: Carol Bell is a 66 y.o. 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 We, therefore, discussed and recommended the following at today's visit.   DISCUSSION: We discussed that about 5-10% of breast  cancer is hereditary with most cases due to BRCA mutations.  We reviewed the characteristics, features and inheritance patterns of hereditary cancer syndromes. Based on the patient's placement in the family tree, she has an estimated 50% risk of carrying the familial BRCA mutation.  We discussed that at 7, she has out lived some of her risk, and therefore her risk for having a mutation could be lower.  However, she did have a TAH-BSO premenopausally, and therefore could have lowered her  risk for breast cancer.  If the patient is negative, then her daughter, who is in the process of setting up testing, would not need to undergo genetic testing.  IF she is positive, then her daughter would have a 50% risk of carrying this same mutation.  We also discussed genetic testing, including the appropriate family members to test, the process of testing, insurance coverage and turn-around-time for results. We discussed the implications of a negative, positive and/or variant of uncertain significant result. We recommended Carol Bell pursue genetic testing for the familial BRCA2 gene mutation.   Based on Carol Bell's family history of cancer, she meets medical criteria for genetic testing. Her nephew underwent genetic testing and was found to have a BRCA2 mutation.  The testing lab will offer free genetic testing for 90 days after his report to all family members.  The patient falls within this 90 day time frame, and therefore should not have a cost to her testing.  We discussed that some people do not want to undergo genetic testing due to fear of genetic discrimination.  A federal law called the Genetic Information Non-Discrimination Act (GINA) of 2008 helps protect individuals against genetic discrimination based on their genetic test results.  It impacts both health insurance and employment.  With health insurance, it protects against increased premiums, being kicked off insurance or being forced to take a test in order to  be insured.  For employment it protects against hiring, firing and promoting decisions based on genetic test results.  Health status due to a cancer diagnosis is not protected under GINA.    In order to estimate her chance of having a BRCA mutation, we used statistical models (Tyrer Cusik) and laboratory data that take into account her personal medical history, family history and ancestry.  Because each model is different, there can be a lot of variability in the risks they give.  Therefore, these numbers must be considered a rough range and not a precise risk of having a BRCA mutation.  These models estimate that she has approximately a 22.14% -50% chance of having a mutation. Based on this assessment of her family and personal history, genetic testing is recommended.  Based on the patient's personal and family history, statistical models (Tyrer Cusik)  and literature data were used to estimate her risk of developing breast cancer. These estimate her lifetime risk of developing breast cancer to be approximately 20.6%. This estimation does not take into account any genetic testing results.  The patient's lifetime breast cancer risk is a preliminary estimate based on available information using one of several models endorsed by the [place] (ACS). The ACS recommends consideration of breast MRI screening as an adjunct to mammography for patients at high risk (defined as 20% or greater lifetime risk). A more detailed breast cancer risk assessment can be considered, if clinically indicated.   Carol Bell has been determined to be at high risk for breast cancer.  Therefore, we recommend that annual screening with mammography and breast MRI.  We discussed that Carol Bell should discuss her individual situation with her referring physician and determine a breast cancer screening plan with which they are both comfortable.     PLAN: After considering the risks, benefits, and limitations, Carol Bell  provided  informed consent to pursue genetic testing and the blood sample was sent to [facility] for analysis of the BRCA2 gene. Results should be available within approximately 2-3 weeks' time, at which point they will be  disclosed by telephone to Carol Bell, as will any additional recommendations warranted by these results. Carol Bell will receive a summary of her genetic counseling visit and a copy of her results once available. This information will also be available in Epic. We encouraged Carol Bell to remain in contact with cancer genetics annually so that we can continuously update the family history and inform her of any changes in cancer genetics and testing that may be of benefit for her family. Carol Bell questions were answered to her satisfaction today. Our contact information was provided should additional questions or concerns arise.  Lastly, we encouraged Carol Bell to remain in contact with cancer genetics annually so that we can continuously update the family history and inform her of any changes in cancer genetics and testing that may be of benefit for this family.   Ms.  Bell questions were answered to her satisfaction today. Our contact information was provided should additional questions or concerns arise. Thank you for the referral and allowing [place] to share in the care of your patient.   Emidio Warrell P. Florene Glen, [place], [facility] Certified Genetic Counselor [email or web address] phone: [phone]  The patient was seen for a total of 60 minutes in face-to-face genetic counseling.  This patient was discussed with Drs. Magrinat, Lindi Adie and/or Burr Medico who agrees with the above.    _______________________________________________________________________ For Office Staff:  Number of people involved in session: 2 Was an Intern/ student involved with case: no

## 2017-09-09 ENCOUNTER — Encounter: Payer: Self-pay | Admitting: Genetic Counselor

## 2017-09-09 ENCOUNTER — Ambulatory Visit: Payer: Self-pay | Admitting: Genetic Counselor

## 2017-09-09 ENCOUNTER — Telehealth: Payer: Self-pay | Admitting: Genetic Counselor

## 2017-09-09 DIAGNOSIS — Z1379 Encounter for other screening for genetic and chromosomal anomalies: Secondary | ICD-10-CM | POA: Insufficient documentation

## 2017-09-09 DIAGNOSIS — Z8481 Family history of carrier of genetic disease: Secondary | ICD-10-CM

## 2017-09-09 DIAGNOSIS — Z800 Family history of malignant neoplasm of digestive organs: Secondary | ICD-10-CM

## 2017-09-09 DIAGNOSIS — Z8041 Family history of malignant neoplasm of ovary: Secondary | ICD-10-CM

## 2017-09-09 DIAGNOSIS — Z803 Family history of malignant neoplasm of breast: Secondary | ICD-10-CM

## 2017-09-09 DIAGNOSIS — Z8042 Family history of malignant neoplasm of prostate: Secondary | ICD-10-CM

## 2017-09-09 NOTE — Progress Notes (Signed)
HPI: Ms. 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 Please refer to our prior cancer genetics clinic note for more information regarding Ms. Mccants's medical, social and family histories, and our assessment and recommendations, at the time. Ms. Marchesi recent genetic test results were disclosed to her, as were recommendations warranted by these results. These results and recommendations are discussed in more detail below.  CANCER HISTORY:   No history exists.    FAMILY HISTORY:  We obtained a detailed, 4-generation family history.  Significant diagnoses are listed below: Family History  Problem Relation Age of Onset  . Dementia Mother   . Breast cancer Father 43  . Prostate cancer Father 92  . Melanoma Father 12  . Breast cancer Sister 35  . Pancreatic cancer Sister   . Diabetes Brother   . Breast cancer Paternal Aunt   . Brain cancer Paternal Uncle   . Kidney disease Maternal Grandmother   . Melanoma Maternal Grandfather   . Diabetes Paternal Grandmother   . Diabetes Paternal Grandfather   . Stomach cancer Paternal Aunt   . Melanoma Paternal Aunt   . Breast cancer Cousin        3 paternal first cousins  . Bladder Cancer Cousin        paternal first cousin  . Prostate cancer Cousin        paternal first cousin  . Prostate cancer Other 66       metastatic prostate cancer  . Breast cancer Other        great niece  . Ovarian cancer Other   . Colon cancer Neg Hx     The patient has two daughters who are cancer free.  One daughter has undergone genetic testing and the other is in the process of setting up an appointment.  The patient has one brother and one sister.  Her sister was diagnosed with breast cancer in her 41's and pancreatic cancer in her late 81's and died at 61.  This sister had two sons, one who has been  diagnosed with prostate cancer.  The patient's brother has diabetes, but no cancer.  Both parents are deceased.  The patient's father was diagnosed with melanoma and breast cancer at age 3 and prostate cancer at 71.  He died at 24.  He had two brothers and five sisters.  One sister had breast cancer and has a son who had bladder cancer and a daughter with breast cancer.  Another sister had stomach cancer and she had one son with prostate cancer.  A third sister did not have cancer but she had a daughter who died of breast cancer. A fourth sister had melanoma, and a 5th sister died in a car accident without a history of cancer.  One brother had a brain cancer and the other brother does not have a history of cancer but has a daughter with breast cancer.  The patient's mother died of complications from dementia.  She had five siblings who had heart disease and dementia, but no cancer.  The maternal grandparents are both deceased.  The grandfather had melanoma and the grandmother had kidney disease.  Patient's maternal ancestors are of English descent, and paternal ancestors are of Caucasian descent. There is no reported Ashkenazi Jewish ancestry. There is no known consanguinity.  GENETIC TEST RESULTS: We recommended Ms. Birkeland pursue  testing for the familial hereditary cancer gene mutation called BRCA2, [id number] (Splice acceptor). Ms. Ballengee's test was normal and did not reveal the familial mutation. We call this result a true negative result because the cancer-causing mutation was identified in Ms. Harten's family, and she did not inherit it.  Given this negative result, Ms. Frogge's chances of developing BRCA2-related cancers are the same as they are in the general population.    ADDITIONAL GENETIC TESTING: We discussed with Ms. Perezperez that there are other genes that are associated with increased cancer risk that can be analyzed. The laboratories that offer such testing look at these additional genes via a  hereditary cancer gene panel. Should Ms. Mysliwiec wish to pursue additional genetic testing, we are happy to discuss and coordinate this testing, at any time.    CANCER SCREENING RECOMMENDATIONS:  This normal result is reassuring and indicates that Ms. Cashwell does not likely have an increased risk of cancer due to a mutation in one of these genes.  We, therefore, recommended  Ms. Kijowski continue to follow the cancer screening guidelines provided by her primary healthcare providers.   Based on the Ms. Fresquez's personal and family history of cancer, as well as her genetic test results, statistical models (Tyrer Cusik)  and literature data were used to estimate her risk of developing breast cancer. These estimate her lifetime risk of developing breast cancer to be approximately 11.9%.  The patient's lifetime breast cancer risk is a preliminary estimate based on available information using one of several models endorsed by the [place] (ACS). The ACS recommends consideration of breast MRI screening as an adjunct to mammography for patients at high risk (defined as 20% or greater lifetime risk). Based on this risk, a breast MRI would not be warranted.     RECOMMENDATIONS FOR FAMILY MEMBERS: Women in this family might be at some increased risk of developing cancer, over the general population risk, simply due to the family history of cancer. We recommended women in this family have a yearly mammogram beginning at age 75, or 75 years younger than the earliest onset of cancer, an annual clinical breast exam, and perform monthly breast self-exams. Women in this family should also have a gynecological exam as recommended by their primary provider. All family members should have a colonoscopy by age 84.  FOLLOW-UP: Lastly, we discussed with Ms. Paradis that cancer genetics is a rapidly advancing field and it is possible that new genetic tests will be appropriate for her and/or her family members in the future.  We encouraged her to remain in contact with cancer genetics on an annual basis so we can update her personal and family histories and let her know of advances in cancer genetics that may benefit this family.   Our contact number was provided. Ms. Gwinner questions were answered to her satisfaction, and she knows she is welcome to call us at anytime with additional questions or concerns.   Roma Kayser, MS, [facility] Certified Genetic Counselor [email or web address]

## 2017-09-09 NOTE — Telephone Encounter (Signed)
Revealed that genetic testing did not find the familial pathogenic BRCA2 mutation that was identified in her nephew.  Since the patient does not have this mutation, her daughters do not need to undergo genetic testing for the familial mutation.

## 2017-09-10 DIAGNOSIS — M1711 Unilateral primary osteoarthritis, right knee: Secondary | ICD-10-CM | POA: Diagnosis not present

## 2017-09-17 ENCOUNTER — Ambulatory Visit: Payer: PPO

## 2017-10-09 ENCOUNTER — Ambulatory Visit
Admission: RE | Admit: 2017-10-09 | Discharge: 2017-10-09 | Disposition: A | Discharge status: Home / Self Care | Payer: PPO | Source: Ambulatory Visit | Attending: Internal Medicine | Admitting: Internal Medicine

## 2017-10-09 DIAGNOSIS — Z1231 Encounter for screening mammogram for malignant neoplasm of breast: Secondary | ICD-10-CM | POA: Diagnosis not present

## 2017-10-18 ENCOUNTER — Encounter (HOSPITAL_COMMUNITY): Payer: Self-pay | Admitting: *Deleted

## 2017-10-18 ENCOUNTER — Other Ambulatory Visit: Payer: Self-pay

## 2017-10-18 ENCOUNTER — Ambulatory Visit (HOSPITAL_COMMUNITY)
Admission: EM | Admit: 2017-10-18 | Discharge: 2017-10-18 | Disposition: A | Discharge status: Home / Self Care | Payer: PPO | Attending: Internal Medicine | Admitting: Internal Medicine

## 2017-10-18 DIAGNOSIS — J029 Acute pharyngitis, unspecified: Secondary | ICD-10-CM | POA: Diagnosis not present

## 2017-10-18 DIAGNOSIS — J020 Streptococcal pharyngitis: Secondary | ICD-10-CM

## 2017-10-18 DIAGNOSIS — R1110 Vomiting, unspecified: Secondary | ICD-10-CM

## 2017-10-18 LAB — POCT RAPID STREP A: Streptococcus, Group A Screen (Direct): POSITIVE — AB

## 2017-10-18 MED ORDER — ONDANSETRON 4 MG PO TBDP: 4.0000 mg | ORAL_TABLET | Freq: Three times a day (TID) | ORAL | 0 refills | Status: DC | PRN

## 2017-10-18 MED ORDER — PENICILLIN G BENZATHINE 1200000 UNIT/2ML IM SUSP
1.2000 10*6.[IU] | Freq: Once | INTRAMUSCULAR | Status: AC
  Administered 2017-10-18: 1.2 10*6.[IU] via INTRAMUSCULAR

## 2017-10-18 MED ORDER — PENICILLIN G BENZATHINE 1200000 UNIT/2ML IM SUSP
INTRAMUSCULAR | Status: AC
  Filled 2017-10-18: qty 2

## 2017-10-18 NOTE — Discharge Instructions (Signed)
Penicillin injection in office today for strep throat.  I have called in some Zofran here pharmacy, you can take if continued to have nausea or vomiting.  Continue Tylenol/Motrin for pain and fever.  Monitor for any worsening of symptoms, trouble breathing, trouble swallowing, swelling of the throat, follow up here or at the emergency department for reevaluation.

## 2017-10-18 NOTE — ED Triage Notes (Addendum)
Fever, sore throat, chills, headaches

## 2017-10-18 NOTE — ED Provider Notes (Signed)
[place]    CSN: [id number] Arrival date & time: 10/18/17  1707     History   Chief Complaint Chief Complaint  Patient presents with  . Sore Throat    HPI Carol Bell is a 67 y.o. female.   67 year old female comes in for 2-day history of sore throat.  Fever, T-max 102, ibuprofen 5 hours ago.  She has had 2 episodes of vomiting, denies current nausea.  She denies cough, congestion, rhinorrhea.  She has had exposure to strep.  Denies chest pain, shortness of breath, wheezing.  Never smoker.      Past Medical History:  Diagnosis Date  . Depression   . Family history of BRCA2 gene positive   . Family history of breast cancer   . Family history of ovarian cancer   . Family history of pancreatic cancer   . Family history of prostate cancer     Patient Active Problem List   Diagnosis Date Noted  . Genetic testing 09/09/2017  . Family history of breast cancer   . Family history of prostate cancer   . Family history of pancreatic cancer   . Family history of ovarian cancer   . Family history of BRCA2 gene positive     Past Surgical History:  Procedure Laterality Date  . ABDOMINAL HYSTERECTOMY    . BREAST REDUCTION SURGERY  2000  . CARPAL TUNNEL RELEASE  2012  . KNEE ARTHROSCOPY  10-04-02&07-05-04   right  . REDUCTION MAMMAPLASTY      OB History    No data available       Home Medications    Prior to Admission medications   Medication Sig Start Date End Date Taking? Authorizing Provider  buPROPion (WELLBUTRIN) 100 MG tablet Take 300 mg by mouth once.   Yes [provider]  GRAPE SEED EXTRACT PO Take 1 tablet by mouth daily.   Yes [provider]  Chlorphen-Phenyleph-ASA (ALKA-SELTZER PLUS COLD PO) Take by mouth.    [provider]  ondansetron (ZOFRAN ODT) 4 MG disintegrating tablet Take 1 tablet (4 mg total) by mouth every 8 (eight) hours as needed for nausea or vomiting. 10/18/17   Tasia Catchings, Amy V, PA-C    Pseudoeph-Doxylamine-DM-APAP (NYQUIL PO) Take by mouth.    [provider]    Family History Family History  Problem Relation Age of Onset  . Dementia Mother   . Breast cancer Father 48  . Prostate cancer Father 55  . Melanoma Father 51  . Breast cancer Sister 80  . Pancreatic cancer Sister   . Diabetes Brother   . Breast cancer Paternal Beryle Lathe of age of onset  . Brain cancer Paternal Uncle   . Kidney disease Maternal Grandmother   . Melanoma Maternal Grandfather   . Diabetes Paternal Grandmother   . Diabetes Paternal Grandfather   . Stomach cancer Paternal Aunt   . Melanoma Paternal Aunt   . Breast cancer Cousin        3 paternal first cousins, ? onset  . Bladder Cancer Cousin        paternal first cousin  . Prostate cancer Cousin        paternal first cousin  . Prostate cancer Other 61       metastatic prostate cancer  . Breast cancer Other        great niece, ? onset  . Ovarian cancer Other   . Colon cancer Neg  Hx     Social History Social History   Tobacco Use  . Smoking status: Never Smoker  . Smokeless tobacco: Never Used  Substance Use Topics  . Alcohol use: No  . Drug use: No     Allergies   Codeine   Review of Systems Review of Systems  Reason unable to perform ROS: See HPI as above.     Physical Exam Triage Vital Signs ED Triage Vitals  Enc Vitals Group     BP 10/18/17 1816 92/62     Pulse Rate 10/18/17 1816 81     Resp --      Temp 10/18/17 1816 98.5 F (36.9 C)     Temp Source 10/18/17 1816 Oral     SpO2 10/18/17 1816 98 %     Weight --      Height --      Head Circumference --      Peak Flow --      Pain Score 10/18/17 1812 7     Pain Loc --      Pain Edu? --      Excl. in [place]? --    No data found.  Updated Vital Signs BP 92/62 (BP Location: Left Arm)   Pulse 81   Temp 98.5 F (36.9 C) (Oral)   SpO2 98%   Physical Exam  Constitutional: She is oriented to person, place, and time. She appears  well-developed and well-nourished. No distress.  HENT:  Head: Normocephalic and atraumatic.  Right Ear: Tympanic membrane, external ear and ear canal normal. Tympanic membrane is not erythematous and not bulging.  Left Ear: Tympanic membrane, external ear and ear canal normal. Tympanic membrane is not erythematous and not bulging.  Nose: Nose normal. Right sinus exhibits no maxillary sinus tenderness and no frontal sinus tenderness. Left sinus exhibits no maxillary sinus tenderness and no frontal sinus tenderness.  Mouth/Throat: Uvula is midline and mucous membranes are normal. Posterior oropharyngeal erythema present. Tonsils are 1+ on the right. Tonsils are 1+ on the left. No tonsillar exudate.  Eyes: Conjunctivae are normal. Pupils are equal, round, and reactive to light.  Neck: Normal range of motion. Neck supple.  Cardiovascular: Normal rate, regular rhythm and normal heart sounds. Exam reveals no gallop and no friction rub.  No murmur heard. Pulmonary/Chest: Effort normal and breath sounds normal. She has no decreased breath sounds. She has no wheezes. She has no rhonchi. She has no rales.  Lymphadenopathy:    She has no cervical adenopathy.  Neurological: She is alert and oriented to person, place, and time.  Skin: Skin is warm and dry.  Psychiatric: She has a normal mood and affect. Her behavior is normal. Judgment normal.     UC Treatments / Results  Labs (all labs ordered are listed, but only abnormal results are displayed) Labs Reviewed  POCT RAPID STREP A - Abnormal; Notable for the following components:      Result Value   Streptococcus, Group A Screen (Direct) POSITIVE (*)    All other components within normal limits    EKG  EKG Interpretation None       Radiology No results found.  Procedures Procedures (including critical care time)  Medications Ordered in UC Medications  penicillin g benzathine (BICILLIN LA) 1200000 UNIT/2ML injection 1.2 Million Units  (1.2 Million Units Intramuscular Given 10/18/17 1907)     Initial Impression / Assessment and Plan / UC Course  I have reviewed the triage vital signs and the nursing  notes.  Pertinent labs & imaging results that were available during my care of the patient were reviewed by me and considered in my medical decision making (see chart for details).    Rapid strep positive.  Patient prefers Bicillin IM, given in office.  Zofran called to pharmacy, can fill if continue with nausea/vomiting.  Push fluids.  Return precautions given.  Patient expresses understanding and agrees to plan.  Final Clinical Impressions(s) / UC Diagnoses   Final diagnoses:  Strep pharyngitis    ED Discharge Orders        Ordered    ondansetron (ZOFRAN ODT) 4 MG disintegrating tablet  Every 8 hours PRN     10/18/17 1855        Ok Edwards, PA-C 10/18/17 1936

## 2017-10-22 DIAGNOSIS — M1711 Unilateral primary osteoarthritis, right knee: Secondary | ICD-10-CM | POA: Diagnosis not present

## 2018-01-27 DIAGNOSIS — H01022 Squamous blepharitis right lower eyelid: Secondary | ICD-10-CM | POA: Diagnosis not present

## 2018-01-27 DIAGNOSIS — H0014 Chalazion left upper eyelid: Secondary | ICD-10-CM | POA: Diagnosis not present

## 2018-01-27 DIAGNOSIS — G43109 Migraine with aura, not intractable, without status migrainosus: Secondary | ICD-10-CM | POA: Diagnosis not present

## 2018-01-27 DIAGNOSIS — H01021 Squamous blepharitis right upper eyelid: Secondary | ICD-10-CM | POA: Diagnosis not present

## 2018-01-27 DIAGNOSIS — H01024 Squamous blepharitis left upper eyelid: Secondary | ICD-10-CM | POA: Diagnosis not present

## 2018-01-27 DIAGNOSIS — H01025 Squamous blepharitis left lower eyelid: Secondary | ICD-10-CM | POA: Diagnosis not present

## 2018-01-27 DIAGNOSIS — H43811 Vitreous degeneration, right eye: Secondary | ICD-10-CM | POA: Diagnosis not present

## 2018-01-27 DIAGNOSIS — H2513 Age-related nuclear cataract, bilateral: Secondary | ICD-10-CM | POA: Diagnosis not present

## 2018-03-15 DIAGNOSIS — M1711 Unilateral primary osteoarthritis, right knee: Secondary | ICD-10-CM | POA: Diagnosis not present

## 2018-05-04 DIAGNOSIS — M1711 Unilateral primary osteoarthritis, right knee: Secondary | ICD-10-CM | POA: Diagnosis not present

## 2018-05-27 DIAGNOSIS — N390 Urinary tract infection, site not specified: Secondary | ICD-10-CM | POA: Diagnosis not present

## 2018-06-02 DIAGNOSIS — M1711 Unilateral primary osteoarthritis, right knee: Secondary | ICD-10-CM | POA: Diagnosis not present

## 2018-06-02 NOTE — Pre-Procedure Instructions (Signed)
[facility]  06/02/2018      [facility] - [place] - [address] [address] [place] [address] Phone: [phone] Fax: [phone]    Your procedure is scheduled on Friday September 16th.  Report to [facility] Admitting at 0530 A.M.  Call this number if you have problems the morning of surgery:  [phone]   Remember:  Do not eat or drink after midnight.    Take these medicines the morning of surgery with A SIP OF WATER   Tylenol (if needed)  Wellbutrin  Eye drops (if needed)  Prilosec    7 days prior to surgery STOP taking any Aspirin(unless otherwise instructed by your surgeon), Aleve, Naproxen, Ibuprofen, Motrin, Advil, Goody's, BC's, all herbal medications, fish oil, and all vitamins     Do not wear jewelry, make-up or nail polish.  Do not wear lotions, powders, or perfumes, or deodorant.  Do not shave 48 hours prior to surgery.  Men may shave face and neck.  Do not bring valuables to the hospital.  [facility] is not responsible for any belongings or valuables.  Contacts, dentures or bridgework may not be worn into surgery.  Leave your suitcase in the car.  After surgery it may be brought to your room.  For patients admitted to the hospital, discharge time will be determined by your treatment team.  Patients discharged the day of surgery will not be allowed to drive home.    [place]- Preparing For Surgery  Before surgery, you can play an important role. Because skin is not sterile, your skin needs to be as free of germs as possible. You can reduce the number of germs on your skin by washing with CHG (chlorahexidine gluconate) Soap before surgery.  CHG is an antiseptic cleaner which kills germs and bonds with the skin to continue killing germs even after washing.    Oral Hygiene is also important to reduce your risk of infection.  Remember - BRUSH YOUR TEETH THE MORNING OF SURGERY WITH  YOUR REGULAR TOOTHPASTE  Please do not use if you have an allergy to CHG or antibacterial soaps. If your skin becomes reddened/irritated stop using the CHG.  Do not shave (including legs and underarms) for at least 48 hours prior to first CHG shower. It is OK to shave your face.  Please follow these instructions carefully.   1. Shower the NIGHT BEFORE SURGERY and the MORNING OF SURGERY with CHG.   2. If you chose to wash your hair, wash your hair first as usual with your normal shampoo.  3. After you shampoo, rinse your hair and body thoroughly to remove the shampoo.  4. Use CHG as you would any other liquid soap. You can apply CHG directly to the skin and wash gently with a scrungie or a clean washcloth.   5. Apply the CHG Soap to your body ONLY FROM THE NECK DOWN.  Do not use on open wounds or open sores. Avoid contact with your eyes, ears, mouth and genitals (private parts). Wash Face and genitals (private parts)  with your normal soap.  6. Wash thoroughly, paying special attention to the area where your surgery will be performed.  7. Thoroughly rinse your body with warm water from the neck down.  8. DO NOT shower/wash with your normal soap after using and rinsing off the CHG Soap.  9. Pat yourself dry with a CLEAN TOWEL.  10.  Wear CLEAN PAJAMAS to bed the night before surgery, wear comfortable clothes the morning of surgery  11. Place CLEAN SHEETS on your bed the night of your first shower and DO NOT SLEEP WITH PETS.    Day of Surgery:  Do not apply any deodorants/lotions.  Please wear clean clothes to the hospital/surgery center.   Remember to brush your teeth WITH YOUR REGULAR TOOTHPASTE.    Please read over the following fact sheets that you were given.

## 2018-06-02 NOTE — H&P (Signed)
TOTAL KNEE ADMISSION H&P  Patient is being admitted for right total knee arthroplasty.  Subjective:  Chief Complaint:right knee pain.  HPI: Carol Bell, 67 y.o. female, has a history of pain and functional disability in the right knee due to arthritis and has failed non-surgical conservative treatments for greater than 12 weeks to includeNSAID's and/or analgesics, corticosteriod injections, viscosupplementation injections, flexibility and strengthening excercises, supervised PT with diminished ADL's post treatment, weight reduction as appropriate and activity modification.  Onset of symptoms was gradual, starting 10 years ago with gradually worsening course since that time. The patient noted prior procedures on the knee to include  arthroscopy and menisectomy on the right knee(s).  Patient currently rates pain in the right knee(s) at 10 out of 10 with activity. Patient has night pain, worsening of pain with activity and weight bearing and pain that interferes with activities of daily living.  Patient has evidence of subchondral sclerosis, periarticular osteophytes and joint space narrowing by imaging studies.  There is no active infection.  Patient Active Problem List   Diagnosis Date Noted  . Genetic testing 09/09/2017  . Family history of breast cancer   . Family history of prostate cancer   . Family history of pancreatic cancer   . Family history of ovarian cancer   . Family history of BRCA2 gene positive    Past Medical History:  Diagnosis Date  . Depression   . Family history of BRCA2 gene positive   . Family history of breast cancer   . Family history of ovarian cancer   . Family history of pancreatic cancer   . Family history of prostate cancer     Past Surgical History:  Procedure Laterality Date  . ABDOMINAL HYSTERECTOMY    . BREAST REDUCTION SURGERY  2000  . CARPAL TUNNEL RELEASE  2012  . KNEE ARTHROSCOPY  10-04-02&07-05-04   right  . REDUCTION MAMMAPLASTY      No  current facility-administered medications for this encounter.    Current Outpatient Medications  Medication Sig Dispense Refill Last Dose  . acetaminophen (TYLENOL) 500 MG tablet Take 1,000 mg by mouth every 4 (four) hours as needed for moderate pain.     Marland Kitchen buPROPion (WELLBUTRIN XL) 300 MG 24 hr tablet Take 300 mg by mouth daily.     . Calcium Carbonate-Vitamin D (CALCIUM 600+D PO) Take 1 tablet by mouth 2 (two) times daily.     . Carboxymethylcellul-Glycerin (LUBRICATING EYE DROPS OP) Place 1 drop into both eyes daily as needed (dry eyes).     Marland Kitchen diclofenac sodium (VOLTAREN) 1 % GEL Apply 1 application topically 4 (four) times daily as needed (pain).     . Glucosamine HCl-MSM (GLUCOSAMINE-MSM PO) Take 1 tablet by mouth 2 (two) times daily.     Marland Kitchen GRAPE SEED EXTRACT PO Take 1 tablet by mouth daily.   10/18/2017 at Unknown time  . ibuprofen (ADVIL,MOTRIN) 200 MG tablet Take 400 mg by mouth every 4 (four) hours as needed for headache or moderate pain.     Marland Kitchen MELATONIN-THEANINE PO Take 1 tablet by mouth at bedtime as needed (sleep).     . Multiple Vitamin (MULTIVITAMIN WITH MINERALS) TABS tablet Take 1 tablet by mouth daily.     . Omega-3 Fatty Acids (FISH OIL) 1000 MG CAPS Take 1,000 mg by mouth daily.     Marland Kitchen omeprazole (PRILOSEC) 20 MG capsule Take 20 mg by mouth daily.      Allergies  Allergen Reactions  . Codeine  Nausea Only  . Other Nausea And Vomiting    general anesthesia     Social History   Tobacco Use  . Smoking status: Never Smoker  . Smokeless tobacco: Never Used  Substance Use Topics  . Alcohol use: No    Family History  Problem Relation Age of Onset  . Dementia Mother   . Breast cancer Father 39  . Prostate cancer Father 74  . Melanoma Father 34  . Breast cancer Sister 63  . Pancreatic cancer Sister   . Diabetes Brother   . Breast cancer Paternal Beryle Lathe of age of onset  . Brain cancer Paternal Uncle   . Kidney disease Maternal Grandmother   . Melanoma  Maternal Grandfather   . Diabetes Paternal Grandmother   . Diabetes Paternal Grandfather   . Stomach cancer Paternal Aunt   . Melanoma Paternal Aunt   . Breast cancer Cousin        3 paternal first cousins, ? onset  . Bladder Cancer Cousin        paternal first cousin  . Prostate cancer Cousin        paternal first cousin  . Prostate cancer Other 73       metastatic prostate cancer  . Breast cancer Other        great niece, ? onset  . Ovarian cancer Other   . Colon cancer Neg Hx      Review of Systems  Constitutional: Negative.   HENT: Negative.   Eyes: Negative.   Respiratory: Negative.   Cardiovascular: Negative.   Gastrointestinal: Negative.   Genitourinary: Negative.   Musculoskeletal: Positive for back pain and joint pain.  Skin: Negative.   Neurological: Negative.   Endo/Heme/Allergies: Negative.   Psychiatric/Behavioral: Negative.     Objective:  Physical Exam  Constitutional: She is oriented to person, place, and time. She appears well-developed and well-nourished.  HENT:  Head: Normocephalic and atraumatic.  Mouth/Throat: Oropharynx is clear and moist.  Eyes: Pupils are equal, round, and reactive to light. Conjunctivae are normal.  Neck: Neck supple.  Cardiovascular: Normal rate and regular rhythm.  Respiratory: Effort normal and breath sounds normal.  GI: Soft. Bowel sounds are normal.  Musculoskeletal:   Examination of her right knee reveals moderate valgus deformity.  1+ synovitis.  Full range of motion.  Knee is stable.  Examination of her left knee reveals full range of motion without pain, swelling, weakness or instability.  Vascular exam: Pulses are 2+ and symmetric.  Neurologic exam: Distal motor and sensory examination is within normal limits.    Neurological: She is alert and oriented to person, place, and time.  Skin: Skin is warm and dry.  Psychiatric: She has a normal mood and affect. Her behavior is normal.    Vital signs in last 24  hours: Temp:  [97.7 F (36.5 C)] 97.7 F (36.5 C) (09/04 1500) Pulse Rate:  [80] 80 (09/04 1500) BP: (116)/(69) 116/69 (09/04 1500) SpO2:  [98 %] 98 % (09/04 1500) Weight:  [73.9 kg] 73.9 kg (09/04 1500)  Labs:   Estimated body mass index is 24.78 kg/m as calculated from the following:   Height as of this encounter: '5\' 8"'  (1.727 m).   Weight as of this encounter: 73.9 kg.   Imaging Review Plain radiographs demonstrate severe degenerative joint disease of the right knee(s). The overall alignment issignificant valgus. The bone quality appears to be good for age and reported activity level.  Preoperative templating of the joint replacement has been completed, documented, and submitted to the Operating Room personnel in order to optimize intra-operative equipment management.   Anticipated LOS equal to or greater than 2 midnights due to - Age 29 and older with one or more of the following:  - Obesity  - Expected need for hospital services (PT, OT, Nursing) required for safe  discharge  - Anticipated need for postoperative skilled nursing care or inpatient rehab  - Active co-morbidities: None OR   - Unanticipated findings during/Post Surgery: None  - Patient is a high risk of re-admission due to: None     Assessment/Plan:  End stage arthritis, right knee   The patient history, physical examination, clinical judgment of the provider and imaging studies are consistent with end stage degenerative joint disease of the right knee(s) and total knee arthroplasty is deemed medically necessary. The treatment options including medical management, injection therapy arthroscopy and arthroplasty were discussed at length. The risks and benefits of total knee arthroplasty were presented and reviewed. The risks due to aseptic loosening, infection, stiffness, patella tracking problems, thromboembolic complications and other imponderables were discussed. The patient acknowledged the explanation,  agreed to proceed with the plan and consent was signed. Patient is being admitted for inpatient treatment for surgery, pain control, PT, OT, prophylactic antibiotics, VTE prophylaxis, progressive ambulation and ADL's and discharge planning. The patient is planning to be discharged home with home health services   This patient is precerted as INPATIENT

## 2018-06-03 ENCOUNTER — Encounter (HOSPITAL_COMMUNITY): Payer: Self-pay

## 2018-06-03 ENCOUNTER — Encounter (HOSPITAL_COMMUNITY)
Admission: RE | Admit: 2018-06-03 | Discharge: 2018-06-03 | Disposition: A | Discharge status: Home / Self Care | Payer: PPO | Source: Ambulatory Visit | Attending: Orthopedic Surgery | Admitting: Orthopedic Surgery

## 2018-06-03 DIAGNOSIS — Z01812 Encounter for preprocedural laboratory examination: Secondary | ICD-10-CM | POA: Insufficient documentation

## 2018-06-03 DIAGNOSIS — M1711 Unilateral primary osteoarthritis, right knee: Secondary | ICD-10-CM | POA: Diagnosis not present

## 2018-06-03 DIAGNOSIS — Z01810 Encounter for preprocedural cardiovascular examination: Secondary | ICD-10-CM | POA: Insufficient documentation

## 2018-06-03 DIAGNOSIS — I4510 Unspecified right bundle-branch block: Secondary | ICD-10-CM | POA: Insufficient documentation

## 2018-06-03 HISTORY — DX: Other specified postprocedural states: Z98.890

## 2018-06-03 HISTORY — DX: Personal history of urinary calculi: Z87.442

## 2018-06-03 HISTORY — DX: Nausea with vomiting, unspecified: R11.2

## 2018-06-03 LAB — COMPREHENSIVE METABOLIC PANEL
ALT: 33 U/L (ref 0–44)
ANION GAP: 11 (ref 5–15)
AST: 30 U/L (ref 15–41)
Albumin: 3.8 g/dL (ref 3.5–5.0)
Alkaline Phosphatase: 64 U/L (ref 38–126)
BILIRUBIN TOTAL: 0.8 mg/dL (ref 0.3–1.2)
BUN: 15 mg/dL (ref 8–23)
CALCIUM: 9.4 mg/dL (ref 8.9–10.3)
CO2: 24 mmol/L (ref 22–32)
Chloride: 104 mmol/L (ref 98–111)
Creatinine, Ser: 0.72 mg/dL (ref 0.44–1.00)
GLUCOSE: 182 mg/dL — AB (ref 70–99)
POTASSIUM: 3.5 mmol/L (ref 3.5–5.1)
Sodium: 139 mmol/L (ref 135–145)
Total Protein: 6.9 g/dL (ref 6.5–8.1)

## 2018-06-03 LAB — CBC WITH DIFFERENTIAL/PLATELET
Abs Immature Granulocytes: 0 10*3/uL (ref 0.0–0.1)
Basophils Absolute: 0.1 10*3/uL (ref 0.0–0.1)
Basophils Relative: 1 %
EOS ABS: 0.1 10*3/uL (ref 0.0–0.7)
EOS PCT: 2 %
HEMATOCRIT: 42.1 % (ref 36.0–46.0)
Hemoglobin: 13.5 g/dL (ref 12.0–15.0)
Immature Granulocytes: 0 %
LYMPHS ABS: 2.6 10*3/uL (ref 0.7–4.0)
Lymphocytes Relative: 43 %
MCH: 28.8 pg (ref 26.0–34.0)
MCHC: 32.1 g/dL (ref 30.0–36.0)
MCV: 90 fL (ref 78.0–100.0)
MONO ABS: 0.6 10*3/uL (ref 0.1–1.0)
Monocytes Relative: 10 %
Neutro Abs: 2.7 10*3/uL (ref 1.7–7.7)
Neutrophils Relative %: 44 %
Platelets: 178 10*3/uL (ref 150–400)
RBC: 4.68 MIL/uL (ref 3.87–5.11)
RDW: 12.4 % (ref 11.5–15.5)
WBC: 6.1 10*3/uL (ref 4.0–10.5)

## 2018-06-03 LAB — TYPE AND SCREEN
ABO/RH(D): O POS
ANTIBODY SCREEN: NEGATIVE

## 2018-06-03 LAB — SURGICAL PCR SCREEN
MRSA, PCR: NEGATIVE
Staphylococcus aureus: NEGATIVE

## 2018-06-03 LAB — PROTIME-INR
INR: 1.05
PROTHROMBIN TIME: 13.6 s (ref 11.4–15.2)

## 2018-06-03 LAB — ABO/RH: ABO/RH(D): O POS

## 2018-06-03 LAB — APTT: APTT: 26 s (ref 24–36)

## 2018-06-03 NOTE — Pre-Procedure Instructions (Signed)
[facility]  06/03/2018      [facility] [place]  - [address] [address] [address] [place] [address] Phone: [phone] Fax: [phone]    Your procedure is scheduled on 06/14/18.  Report to [facility] Admitting at 530 A.M.  Call this number if you have problems the morning of surgery:  [phone]   Remember:TAKE THESE THE MEDS THE MORNING OF SURGERY-TYLENOL,WELLBUTRIN,PRILOSEC      Do not wear jewelry, make-up or nail polish.  Do not wear lotions, powders, or perfumes, or deodorant.  Do not shave 48 hours prior to surgery.  Men may shave face and neck.  Do not bring valuables to the hospital.  [facility] is not responsible for any belongings or valuables.  Contacts, dentures or bridgework may not be worn into surgery.  Leave your suitcase in the car.  After surgery it may be brought to your room.  For patients admitted to the hospital, discharge time will be determined by your treatment team.  Patients discharged the day of surgery will not be allowed to drive home.   Name and phone number of your driver:   Do not take any aspirin,anti-inflammatories,vitamins,or herbal supplements 5-7 days prior to surgery.Do not take any aspirin,anti-inflammatories,vitamins,or herbal supplements 5-7 days prior to surgery.Special instructions:  [place] - Preparing for Surgery  Before surgery, you can play an important role.  Because skin is not sterile, your skin needs to be as free of germs as possible.  You can reduce the number of germs on you skin by washing with CHG (chlorahexidine gluconate) soap before surgery.  CHG is an antiseptic cleaner which kills germs and bonds with the skin to continue killing germs even after washing.  Oral Hygiene is also important in reducing the risk of infection.  Remember to brush your teeth with your regular toothpaste the morning of surgery.  Please DO NOT use if you have an  allergy to CHG or antibacterial soaps.  If your skin becomes reddened/irritated stop using the CHG and inform your nurse when you arrive at Short Stay.  Do not shave (including legs and underarms) for at least 48 hours prior to the first CHG shower.  You may shave your face.  Please follow these instructions carefully:   1.  Shower with CHG Soap the night before surgery and the morning of Surgery.  2.  If you choose to wash your hair, wash your hair first as usual with your normal shampoo.  3.  After you shampoo, rinse your hair and body thoroughly to remove the shampoo. 4.  Use CHG as you would any other liquid soap.  You can apply chg directly to the skin and wash gently with a      scrungie or washcloth.           5.  Apply the CHG Soap to your body ONLY FROM THE NECK DOWN.   Do not use on open wounds or open sores. Avoid contact with your eyes, ears, mouth and genitals (private parts).  Wash genitals (private parts) with your normal soap.  6.  Wash thoroughly, paying special attention to the area where your surgery will be performed.  7.  Thoroughly rinse your body with warm water from the neck down.  8.  DO NOT shower/wash with your normal soap after using and rinsing off the CHG Soap.  9.  Pat yourself dry with a clean towel.  10.  Wear clean pajamas.            11.  Place clean sheets on your bed the night of your first shower and do not sleep with pets.  Day of Surgery  Do not apply any lotions/deoderants the morning of surgery.   Please wear clean clothes to the hospital/surgery center. Remember to brush your teeth with toothpaste.  [place] - Preparing for Surgery  Before surgery, you can play an important role.  Because skin is not sterile, your skin needs to be as free of germs as possible.  You can reduce the number of germs on you skin by washing with CHG (chlorahexidine gluconate) soap before surgery.  CHG is an antiseptic cleaner which kills germs and bonds  with the skin to continue killing germs even after washing.  Oral Hygiene is also important in reducing the risk of infection.  Remember to brush your teeth with your regular toothpaste the morning of surgery.  Please DO NOT use if you have an allergy to CHG or antibacterial soaps.  If your skin becomes reddened/irritated stop using the CHG and inform your nurse when you arrive at Short Stay.  Do not shave (including legs and underarms) for at least 48 hours prior to the first CHG shower.  You may shave your face.  Please follow these instructions carefully:   1.  Shower with CHG Soap the night before surgery and the morning of Surgery.  2.  If you choose to wash your hair, wash your hair first as usual with your normal shampoo.  3.  After you shampoo, rinse your hair and body thoroughly to remove the shampoo. 4.  Use CHG as you would any other liquid soap.  You can apply chg directly to the skin and wash gently with a      scrungie or washcloth.           5.  Apply the CHG Soap to your body ONLY FROM THE NECK DOWN.   Do not use on open wounds or open sores. Avoid contact with your eyes, ears, mouth and genitals (private parts).  Wash genitals (private parts) with your normal soap.  6.  Wash thoroughly, paying special attention to the area where your surgery will be performed.  7.  Thoroughly rinse your body with warm water from the neck down.  8.  DO NOT shower/wash with your normal soap after using and rinsing off the CHG Soap.  9.  Pat yourself dry with a clean towel.            10.  Wear clean pajamas.            11.  Place clean sheets on your bed the night of your first shower and do not sleep with pets.  Day of Surgery  Do not apply any lotions/deoderants the morning of surgery.   Please wear clean clothes to the hospital/surgery center. Remember to brush your teeth with toothpaste.    Please read over the following fact sheets that you were given. MRSA Information

## 2018-06-04 DIAGNOSIS — R9431 Abnormal electrocardiogram [ECG] [EKG]: Secondary | ICD-10-CM | POA: Diagnosis not present

## 2018-06-04 DIAGNOSIS — M1711 Unilateral primary osteoarthritis, right knee: Secondary | ICD-10-CM | POA: Diagnosis not present

## 2018-06-04 DIAGNOSIS — Z0189 Encounter for other specified special examinations: Secondary | ICD-10-CM | POA: Diagnosis not present

## 2018-06-04 DIAGNOSIS — Z01818 Encounter for other preprocedural examination: Secondary | ICD-10-CM | POA: Diagnosis not present

## 2018-06-04 LAB — URINE CULTURE: Culture: NO GROWTH

## 2018-06-04 NOTE — Progress Notes (Signed)
Anesthesia Chart Review:  Case:  [id number] Date/Time:  06/14/18 0700   Procedure:  TOTAL KNEE ARTHROPLASTY (Right )   Anesthesia type:  Spinal   Pre-op diagnosis:  djd right knee   Location:  [facility] OR ROOM 07 / [facility]   Surgeon:  Elsie Saas, MD      DISCUSSION: Patient is a 67 year old female scheduled for the above procedure.   History includes never smoker, post-operative N/V. Non-fasting glucose 182--no history of DM. Will check a fasting CBG on the day of surgery. Defer additional orders to surgeon. Based on currently available information, I would anticipate that she can proceed as planned.    VS: BP 121/67   Pulse 69   Temp 36.4 C   Resp 20   Ht '5\' 9"'  (1.753 m)   Wt 74.1 kg   SpO2 97%   BMI 24.13 kg/m   PROVIDERS: Levin Erp, MD is PCP   LABS: Labs reviewed: Acceptable for surgery. (all labs ordered are listed, but only abnormal results are displayed)  Labs Reviewed  COMPREHENSIVE METABOLIC PANEL - Abnormal; Notable for the following components:      Result Value   Glucose, Bld 182 (*)    All other components within normal limits  SURGICAL PCR SCREEN  URINE CULTURE  APTT  CBC WITH DIFFERENTIAL/PLATELET  PROTIME-INR  TYPE AND SCREEN  ABO/RH    EKG: 06/03/18: SR with first degree AV block. LAD. Incomplete right BBB. 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   CV:  ETT 09/03/12:  Impression: Negative ETT with artifact noted.   Past Medical History:  Diagnosis Date  . Depression   . Family history of BRCA2 gene positive   . Family history of breast cancer   . Family history of ovarian cancer   . Family history of pancreatic cancer   . Family history of prostate cancer   . History of kidney stones   . PONV (postoperative nausea and vomiting)     Past Surgical History:  Procedure Laterality Date  . ABDOMINAL HYSTERECTOMY    . BREAST REDUCTION SURGERY  2000  . CARPAL TUNNEL RELEASE  2012  . KNEE ARTHROSCOPY   10-04-02&07-05-04   right  . REDUCTION MAMMAPLASTY      MEDICATIONS: . acetaminophen (TYLENOL) 500 MG tablet  . buPROPion (WELLBUTRIN XL) 300 MG 24 hr tablet  . Calcium Carbonate-Vitamin D (CALCIUM 600+D PO)  . Carboxymethylcellul-Glycerin (LUBRICATING EYE DROPS OP)  . diclofenac sodium (VOLTAREN) 1 % GEL  . Glucosamine HCl-MSM (GLUCOSAMINE-MSM PO)  . GRAPE SEED EXTRACT PO  . ibuprofen (ADVIL,MOTRIN) 200 MG tablet  . MELATONIN-THEANINE PO  . Multiple Vitamin (MULTIVITAMIN WITH MINERALS) TABS tablet  . Omega-3 Fatty Acids (FISH OIL) 1000 MG CAPS  . omeprazole (PRILOSEC) 20 MG capsule   No current facility-administered medications for this encounter.     George Hugh [facility] Short Stay Center/Anesthesiology Phone [phone] 06/04/2018 4:52 PM

## 2018-06-10 DIAGNOSIS — Z23 Encounter for immunization: Secondary | ICD-10-CM | POA: Diagnosis not present

## 2018-06-10 DIAGNOSIS — E119 Type 2 diabetes mellitus without complications: Secondary | ICD-10-CM | POA: Diagnosis not present

## 2018-06-11 MED ORDER — BUPIVACAINE LIPOSOME 1.3 % IJ SUSP
20.0000 mL | Freq: Once | INTRAMUSCULAR | Status: DC
  Filled 2018-06-11: qty 20

## 2018-06-11 MED ORDER — TRANEXAMIC ACID 1000 MG/10ML IV SOLN
1000.0000 mg | INTRAVENOUS | Status: AC
  Administered 2018-06-14: 1000 mg via INTRAVENOUS
  Filled 2018-06-11: qty 1000

## 2018-06-14 ENCOUNTER — Inpatient Hospital Stay (HOSPITAL_COMMUNITY): Payer: PPO | Admitting: Vascular Surgery

## 2018-06-14 ENCOUNTER — Encounter (HOSPITAL_COMMUNITY): Payer: Self-pay

## 2018-06-14 ENCOUNTER — Inpatient Hospital Stay (HOSPITAL_COMMUNITY)
Admission: RE | Admit: 2018-06-14 | Discharge: 2018-06-15 | DRG: 470 | Disposition: A | Discharge status: Home Health | Payer: PPO | Attending: Orthopedic Surgery | Admitting: Orthopedic Surgery

## 2018-06-14 ENCOUNTER — Encounter (HOSPITAL_COMMUNITY)
Admission: RE | Disposition: A | Discharge status: Home Health | Payer: Self-pay | Source: Home / Self Care | Attending: Orthopedic Surgery

## 2018-06-14 ENCOUNTER — Other Ambulatory Visit: Payer: Self-pay

## 2018-06-14 DIAGNOSIS — Z87442 Personal history of urinary calculi: Secondary | ICD-10-CM

## 2018-06-14 DIAGNOSIS — Z808 Family history of malignant neoplasm of other organs or systems: Secondary | ICD-10-CM | POA: Diagnosis not present

## 2018-06-14 DIAGNOSIS — Z90710 Acquired absence of both cervix and uterus: Secondary | ICD-10-CM

## 2018-06-14 DIAGNOSIS — Z791 Long term (current) use of non-steroidal anti-inflammatories (NSAID): Secondary | ICD-10-CM

## 2018-06-14 DIAGNOSIS — E669 Obesity, unspecified: Secondary | ICD-10-CM | POA: Diagnosis present

## 2018-06-14 DIAGNOSIS — Z6824 Body mass index (BMI) 24.0-24.9, adult: Secondary | ICD-10-CM

## 2018-06-14 DIAGNOSIS — Z800 Family history of malignant neoplasm of digestive organs: Secondary | ICD-10-CM | POA: Diagnosis not present

## 2018-06-14 DIAGNOSIS — Z833 Family history of diabetes mellitus: Secondary | ICD-10-CM | POA: Diagnosis not present

## 2018-06-14 DIAGNOSIS — Z96651 Presence of right artificial knee joint: Secondary | ICD-10-CM | POA: Diagnosis not present

## 2018-06-14 DIAGNOSIS — M25761 Osteophyte, right knee: Secondary | ICD-10-CM | POA: Diagnosis present

## 2018-06-14 DIAGNOSIS — M1711 Unilateral primary osteoarthritis, right knee: Secondary | ICD-10-CM | POA: Diagnosis not present

## 2018-06-14 DIAGNOSIS — Z8052 Family history of malignant neoplasm of bladder: Secondary | ICD-10-CM

## 2018-06-14 DIAGNOSIS — Z79890 Hormone replacement therapy (postmenopausal): Secondary | ICD-10-CM | POA: Diagnosis not present

## 2018-06-14 DIAGNOSIS — Z8041 Family history of malignant neoplasm of ovary: Secondary | ICD-10-CM | POA: Diagnosis not present

## 2018-06-14 DIAGNOSIS — Z885 Allergy status to narcotic agent status: Secondary | ICD-10-CM | POA: Diagnosis not present

## 2018-06-14 DIAGNOSIS — G8918 Other acute postprocedural pain: Secondary | ICD-10-CM | POA: Diagnosis not present

## 2018-06-14 DIAGNOSIS — Z803 Family history of malignant neoplasm of breast: Secondary | ICD-10-CM | POA: Diagnosis not present

## 2018-06-14 DIAGNOSIS — F329 Major depressive disorder, single episode, unspecified: Secondary | ICD-10-CM | POA: Diagnosis present

## 2018-06-14 DIAGNOSIS — M21061 Valgus deformity, not elsewhere classified, right knee: Secondary | ICD-10-CM | POA: Diagnosis present

## 2018-06-14 DIAGNOSIS — Z8042 Family history of malignant neoplasm of prostate: Secondary | ICD-10-CM | POA: Diagnosis not present

## 2018-06-14 DIAGNOSIS — Z79899 Other long term (current) drug therapy: Secondary | ICD-10-CM | POA: Diagnosis not present

## 2018-06-14 HISTORY — PX: TOTAL KNEE ARTHROPLASTY: SHX125

## 2018-06-14 SURGERY — ARTHROPLASTY, KNEE, TOTAL
Anesthesia: Regional | Site: Knee | Laterality: Right

## 2018-06-14 MED ORDER — ONDANSETRON HCL 4 MG/2ML IJ SOLN
INTRAMUSCULAR | Status: DC | PRN
  Administered 2018-06-14: 4 mg via INTRAVENOUS

## 2018-06-14 MED ORDER — CEFAZOLIN SODIUM-DEXTROSE 2-4 GM/100ML-% IV SOLN
2.0000 g | INTRAVENOUS | Status: AC
  Administered 2018-06-14: 2 g via INTRAVENOUS

## 2018-06-14 MED ORDER — BUPIVACAINE LIPOSOME 1.3 % IJ SUSP
INTRAMUSCULAR | Status: DC | PRN
  Administered 2018-06-14: 20 mL

## 2018-06-14 MED ORDER — ALUM & MAG HYDROXIDE-SIMETH 200-200-20 MG/5ML PO SUSP: 30.0000 mL | ORAL | Status: DC | PRN

## 2018-06-14 MED ORDER — PROPOFOL 500 MG/50ML IV EMUL
INTRAVENOUS | Status: DC | PRN
  Administered 2018-06-14: 50 ug/kg/min via INTRAVENOUS

## 2018-06-14 MED ORDER — DEXAMETHASONE SODIUM PHOSPHATE 10 MG/ML IJ SOLN
10.0000 mg | Freq: Three times a day (TID) | INTRAMUSCULAR | Status: DC
  Administered 2018-06-14 – 2018-06-15 (×3): 10 mg via INTRAVENOUS
  Filled 2018-06-14 (×3): qty 1

## 2018-06-14 MED ORDER — HYDROMORPHONE HCL 1 MG/ML IJ SOLN
INTRAMUSCULAR | Status: AC
  Filled 2018-06-14: qty 1

## 2018-06-14 MED ORDER — FENTANYL CITRATE (PF) 100 MCG/2ML IJ SOLN
INTRAMUSCULAR | Status: AC
  Filled 2018-06-14: qty 2

## 2018-06-14 MED ORDER — LACTATED RINGERS IV SOLN: INTRAVENOUS | Status: DC

## 2018-06-14 MED ORDER — FENTANYL CITRATE (PF) 100 MCG/2ML IJ SOLN
INTRAMUSCULAR | Status: DC | PRN
  Administered 2018-06-14: 100 ug via INTRAVENOUS
  Administered 2018-06-14: 50 ug via INTRAVENOUS

## 2018-06-14 MED ORDER — MIDAZOLAM HCL 2 MG/2ML IJ SOLN
INTRAMUSCULAR | Status: AC
  Filled 2018-06-14: qty 2

## 2018-06-14 MED ORDER — CALCIUM CARBONATE-VITAMIN D 500-200 MG-UNIT PO TABS
1.0000 | ORAL_TABLET | Freq: Two times a day (BID) | ORAL | Status: DC
  Administered 2018-06-14 – 2018-06-15 (×2): 1 via ORAL
  Filled 2018-06-14 (×2): qty 1

## 2018-06-14 MED ORDER — PROPOFOL 10 MG/ML IV BOLUS
INTRAVENOUS | Status: DC | PRN
  Administered 2018-06-14 (×3): 20 mg via INTRAVENOUS

## 2018-06-14 MED ORDER — PANTOPRAZOLE SODIUM 40 MG PO TBEC
40.0000 mg | DELAYED_RELEASE_TABLET | Freq: Every day | ORAL | Status: DC
  Administered 2018-06-15: 40 mg via ORAL
  Filled 2018-06-14: qty 1

## 2018-06-14 MED ORDER — ONDANSETRON HCL 4 MG PO TABS: 4.0000 mg | ORAL_TABLET | Freq: Four times a day (QID) | ORAL | Status: DC | PRN

## 2018-06-14 MED ORDER — MENTHOL 3 MG MT LOZG: 1.0000 | LOZENGE | OROMUCOSAL | Status: DC | PRN

## 2018-06-14 MED ORDER — BUPROPION HCL ER (XL) 150 MG PO TB24
300.0000 mg | ORAL_TABLET | Freq: Every day | ORAL | Status: DC
  Administered 2018-06-15: 300 mg via ORAL
  Filled 2018-06-14: qty 2

## 2018-06-14 MED ORDER — MIDAZOLAM HCL 5 MG/5ML IJ SOLN
INTRAMUSCULAR | Status: DC | PRN
  Administered 2018-06-14: 2 mg via INTRAVENOUS

## 2018-06-14 MED ORDER — PHENOL 1.4 % MT LIQD: 1.0000 | OROMUCOSAL | Status: DC | PRN

## 2018-06-14 MED ORDER — PROPOFOL 10 MG/ML IV BOLUS
INTRAVENOUS | Status: AC
  Filled 2018-06-14: qty 20

## 2018-06-14 MED ORDER — CARBOXYMETHYLCELLUL-GLYCERIN 0.5-0.9 % OP SOLN: 1.0000 [drp] | Freq: Three times a day (TID) | OPHTHALMIC | Status: DC | PRN

## 2018-06-14 MED ORDER — ACETAMINOPHEN 500 MG PO TABS
1000.0000 mg | ORAL_TABLET | Freq: Four times a day (QID) | ORAL | Status: AC
  Administered 2018-06-14 – 2018-06-15 (×4): 1000 mg via ORAL
  Filled 2018-06-14 (×4): qty 2

## 2018-06-14 MED ORDER — POLYETHYLENE GLYCOL 3350 17 G PO PACK
17.0000 g | PACK | Freq: Two times a day (BID) | ORAL | Status: DC
  Administered 2018-06-14 – 2018-06-15 (×2): 17 g via ORAL
  Filled 2018-06-14 (×2): qty 1

## 2018-06-14 MED ORDER — SODIUM CHLORIDE 0.9 % IR SOLN
Status: DC | PRN
  Administered 2018-06-14: 3000 mL

## 2018-06-14 MED ORDER — BUPIVACAINE-EPINEPHRINE 0.5% -1:200000 IJ SOLN
INTRAMUSCULAR | Status: DC | PRN
  Administered 2018-06-14: 50 mL

## 2018-06-14 MED ORDER — MEPERIDINE HCL 50 MG/ML IJ SOLN: 6.2500 mg | INTRAMUSCULAR | Status: DC | PRN

## 2018-06-14 MED ORDER — LIDOCAINE 2% (20 MG/ML) 5 ML SYRINGE
INTRAMUSCULAR | Status: AC
  Filled 2018-06-14: qty 5

## 2018-06-14 MED ORDER — ONDANSETRON HCL 4 MG/2ML IJ SOLN
4.0000 mg | Freq: Four times a day (QID) | INTRAMUSCULAR | Status: DC | PRN
  Administered 2018-06-14: 4 mg via INTRAVENOUS
  Filled 2018-06-14: qty 2

## 2018-06-14 MED ORDER — BUPIVACAINE-EPINEPHRINE 0.5% -1:200000 IJ SOLN
INTRAMUSCULAR | Status: AC
  Filled 2018-06-14: qty 1

## 2018-06-14 MED ORDER — HYDROMORPHONE HCL 1 MG/ML IJ SOLN
0.5000 mg | INTRAMUSCULAR | Status: DC | PRN
  Administered 2018-06-14: 1 mg via INTRAVENOUS
  Administered 2018-06-14: 0.5 mg via INTRAVENOUS
  Filled 2018-06-14 (×2): qty 1

## 2018-06-14 MED ORDER — CEFAZOLIN SODIUM-DEXTROSE 2-4 GM/100ML-% IV SOLN
INTRAVENOUS | Status: AC
  Filled 2018-06-14: qty 100

## 2018-06-14 MED ORDER — DIPHENHYDRAMINE HCL 12.5 MG/5ML PO ELIX: 12.5000 mg | ORAL_SOLUTION | ORAL | Status: DC | PRN

## 2018-06-14 MED ORDER — LACTATED RINGERS IV SOLN
INTRAVENOUS | Status: DC | PRN
  Administered 2018-06-14 (×2): via INTRAVENOUS

## 2018-06-14 MED ORDER — FENTANYL CITRATE (PF) 250 MCG/5ML IJ SOLN
INTRAMUSCULAR | Status: AC
  Filled 2018-06-14: qty 5

## 2018-06-14 MED ORDER — SODIUM CHLORIDE 0.9 % IJ SOLN
INTRAMUSCULAR | Status: DC | PRN
  Administered 2018-06-14: 50 mL

## 2018-06-14 MED ORDER — HYDROMORPHONE HCL 1 MG/ML IJ SOLN
0.2500 mg | INTRAMUSCULAR | Status: DC | PRN
  Administered 2018-06-14: 0.5 mg via INTRAVENOUS

## 2018-06-14 MED ORDER — 0.9 % SODIUM CHLORIDE (POUR BTL) OPTIME
TOPICAL | Status: DC | PRN
  Administered 2018-06-14: 1000 mL

## 2018-06-14 MED ORDER — GABAPENTIN 300 MG PO CAPS
300.0000 mg | ORAL_CAPSULE | Freq: Every day | ORAL | Status: DC
  Administered 2018-06-14: 300 mg via ORAL
  Filled 2018-06-14: qty 1

## 2018-06-14 MED ORDER — POVIDONE-IODINE 7.5 % EX SOLN: Freq: Once | CUTANEOUS | Status: DC

## 2018-06-14 MED ORDER — CHLORHEXIDINE GLUCONATE 4 % EX LIQD: 60.0000 mL | Freq: Once | CUTANEOUS | Status: DC

## 2018-06-14 MED ORDER — ONDANSETRON HCL 4 MG/2ML IJ SOLN
INTRAMUSCULAR | Status: AC
  Filled 2018-06-14: qty 2

## 2018-06-14 MED ORDER — DEXAMETHASONE SODIUM PHOSPHATE 10 MG/ML IJ SOLN
INTRAMUSCULAR | Status: AC
  Filled 2018-06-14: qty 1

## 2018-06-14 MED ORDER — METOCLOPRAMIDE HCL 5 MG PO TABS: 5.0000 mg | ORAL_TABLET | Freq: Three times a day (TID) | ORAL | Status: DC | PRN

## 2018-06-14 MED ORDER — ROPIVACAINE HCL 7.5 MG/ML IJ SOLN
INTRAMUSCULAR | Status: DC | PRN
  Administered 2018-06-14: 20 mL via PERINEURAL

## 2018-06-14 MED ORDER — METOCLOPRAMIDE HCL 5 MG/ML IJ SOLN: 5.0000 mg | Freq: Three times a day (TID) | INTRAMUSCULAR | Status: DC | PRN

## 2018-06-14 MED ORDER — BUPIVACAINE IN DEXTROSE 0.75-8.25 % IT SOLN
INTRATHECAL | Status: DC | PRN
  Administered 2018-06-14: 1.6 mg via INTRATHECAL

## 2018-06-14 MED ORDER — ONDANSETRON HCL 4 MG/2ML IJ SOLN: 4.0000 mg | Freq: Once | INTRAMUSCULAR | Status: DC | PRN

## 2018-06-14 MED ORDER — DEXAMETHASONE SODIUM PHOSPHATE 4 MG/ML IJ SOLN
INTRAMUSCULAR | Status: DC | PRN
  Administered 2018-06-14: 10 mg via INTRAVENOUS

## 2018-06-14 MED ORDER — OXYCODONE HCL 5 MG PO TABS
5.0000 mg | ORAL_TABLET | ORAL | Status: DC | PRN
  Administered 2018-06-14 – 2018-06-15 (×3): 10 mg via ORAL
  Administered 2018-06-15: 5 mg via ORAL
  Filled 2018-06-14: qty 1
  Filled 2018-06-14 (×3): qty 2

## 2018-06-14 MED ORDER — ASPIRIN EC 325 MG PO TBEC
325.0000 mg | DELAYED_RELEASE_TABLET | Freq: Every day | ORAL | Status: DC
  Administered 2018-06-15: 325 mg via ORAL
  Filled 2018-06-14: qty 1

## 2018-06-14 MED ORDER — POTASSIUM CHLORIDE IN NACL 20-0.9 MEQ/L-% IV SOLN
INTRAVENOUS | Status: DC
  Administered 2018-06-14: 15:00:00 via INTRAVENOUS
  Filled 2018-06-14 (×2): qty 1000

## 2018-06-14 MED ORDER — CEFAZOLIN SODIUM-DEXTROSE 2-4 GM/100ML-% IV SOLN
2.0000 g | Freq: Four times a day (QID) | INTRAVENOUS | Status: AC
  Administered 2018-06-14 (×2): 2 g via INTRAVENOUS
  Filled 2018-06-14 (×2): qty 100

## 2018-06-14 MED ORDER — DOCUSATE SODIUM 100 MG PO CAPS
100.0000 mg | ORAL_CAPSULE | Freq: Two times a day (BID) | ORAL | Status: DC
  Administered 2018-06-14 – 2018-06-15 (×2): 100 mg via ORAL
  Filled 2018-06-14 (×2): qty 1

## 2018-06-14 MED ORDER — LIDOCAINE 2% (20 MG/ML) 5 ML SYRINGE
INTRAMUSCULAR | Status: DC | PRN
  Administered 2018-06-14: 40 mg via INTRAVENOUS

## 2018-06-14 SURGICAL SUPPLY — 76 items
APL SKNCLS STERI-STRIP NONHPOA (GAUZE/BANDAGES/DRESSINGS) ×1
ATTUNE MED DOME PAT 32 KNEE (Knees) ×1 IMPLANT
ATTUNE MED DOME PAT 32MM KNEE (Knees) ×1 IMPLANT
ATTUNE PS FEM RT SZ 5 CEM KNEE (Femur) ×2 IMPLANT
ATTUNE PSRP INSR SZ5 6 KNEE (Insert) ×1 IMPLANT
ATTUNE PSRP INSR SZ5 6MM KNEE (Insert) ×1 IMPLANT
BANDAGE ESMARK 6X9 LF (GAUZE/BANDAGES/DRESSINGS) ×1 IMPLANT
BASE TIBIAL ROT PLAT SZ 5 KNEE (Knees) IMPLANT
BENZOIN TINCTURE PRP APPL 2/3 (GAUZE/BANDAGES/DRESSINGS) ×3 IMPLANT
BLADE SAGITTAL 25.0X1.19X90 (BLADE) ×2 IMPLANT
BLADE SAGITTAL 25.0X1.19X90MM (BLADE) ×1
BLADE SAW SGTL 13X75X1.27 (BLADE) ×3 IMPLANT
BLADE SURG 10 STRL SS (BLADE) ×6 IMPLANT
BNDG CMPR 9X6 STRL LF SNTH (GAUZE/BANDAGES/DRESSINGS) ×1
BNDG CMPR MED 15X6 ELC VLCR LF (GAUZE/BANDAGES/DRESSINGS) ×1
BNDG ELASTIC 6X15 VLCR STRL LF (GAUZE/BANDAGES/DRESSINGS) ×3 IMPLANT
BNDG ESMARK 6X9 LF (GAUZE/BANDAGES/DRESSINGS) ×3
BOWL SMART MIX CTS (DISPOSABLE) ×3 IMPLANT
BSPLAT TIB 5 CMNT ROT PLAT STR (Knees) ×1 IMPLANT
CEMENT HV SMART SET (Cement) ×6 IMPLANT
CLOSURE WOUND 1/2 X4 (GAUZE/BANDAGES/DRESSINGS) ×1
COVER SURGICAL LIGHT HANDLE (MISCELLANEOUS) ×3 IMPLANT
CUFF TOURNIQUET SINGLE 34IN LL (TOURNIQUET CUFF) ×3 IMPLANT
CUFF TOURNIQUET SINGLE 44IN (TOURNIQUET CUFF) IMPLANT
DECANTER SPIKE VIAL GLASS SM (MISCELLANEOUS) ×3 IMPLANT
DRAPE EXTREMITY T 121X128X90 (DRAPE) ×3 IMPLANT
DRAPE HALF SHEET 40X57 (DRAPES) ×6 IMPLANT
DRAPE INCISE IOBAN 66X45 STRL (DRAPES) ×2 IMPLANT
DRAPE ORTHO SPLIT 77X108 STRL (DRAPES) ×3
DRAPE SURG ORHT 6 SPLT 77X108 (DRAPES) ×1 IMPLANT
DRAPE U-SHAPE 47X51 STRL (DRAPES) ×3 IMPLANT
DRSG AQUACEL AG ADV 3.5X10 (GAUZE/BANDAGES/DRESSINGS) ×3 IMPLANT
DURAPREP 26ML APPLICATOR (WOUND CARE) ×3 IMPLANT
ELECT CAUTERY BLADE 6.4 (BLADE) ×3 IMPLANT
ELECT REM PT RETURN 9FT ADLT (ELECTROSURGICAL) ×3
ELECTRODE REM PT RTRN 9FT ADLT (ELECTROSURGICAL) ×1 IMPLANT
FACESHIELD WRAPAROUND (MASK) ×3 IMPLANT
FACESHIELD WRAPAROUND OR TEAM (MASK) ×1 IMPLANT
GLOVE BIO SURGEON STRL SZ7 (GLOVE) ×3 IMPLANT
GLOVE BIOGEL PI IND STRL 7.0 (GLOVE) ×1 IMPLANT
GLOVE BIOGEL PI IND STRL 7.5 (GLOVE) ×1 IMPLANT
GLOVE BIOGEL PI INDICATOR 7.0 (GLOVE) ×2
GLOVE BIOGEL PI INDICATOR 7.5 (GLOVE) ×2
GLOVE SS BIOGEL STRL SZ 7.5 (GLOVE) ×1 IMPLANT
GLOVE SUPERSENSE BIOGEL SZ 7.5 (GLOVE) ×2
GOWN STRL REUS W/ TWL LRG LVL3 (GOWN DISPOSABLE) ×1 IMPLANT
GOWN STRL REUS W/ TWL XL LVL3 (GOWN DISPOSABLE) ×1 IMPLANT
GOWN STRL REUS W/TWL LRG LVL3 (GOWN DISPOSABLE) ×3
GOWN STRL REUS W/TWL XL LVL3 (GOWN DISPOSABLE) ×3
HANDPIECE INTERPULSE COAX TIP (DISPOSABLE) ×3
HOOD PEEL AWAY FACE SHEILD DIS (HOOD) ×6 IMPLANT
IMMOBILIZER KNEE 22 UNIV (SOFTGOODS) ×3 IMPLANT
KIT BASIN OR (CUSTOM PROCEDURE TRAY) ×3 IMPLANT
KIT TURNOVER KIT B (KITS) ×3 IMPLANT
MANIFOLD NEPTUNE II (INSTRUMENTS) ×3 IMPLANT
MARKER SKIN DUAL TIP RULER LAB (MISCELLANEOUS) ×3 IMPLANT
NEEDLE HYPO 22GX1.5 SAFETY (NEEDLE) ×6 IMPLANT
NS IRRIG 1000ML POUR BTL (IV SOLUTION) ×3 IMPLANT
PACK TOTAL JOINT (CUSTOM PROCEDURE TRAY) ×3 IMPLANT
PAD ARMBOARD 7.5X6 YLW CONV (MISCELLANEOUS) ×6 IMPLANT
PIN STEINMAN FIXATION KNEE (PIN) ×2 IMPLANT
SET HNDPC FAN SPRY TIP SCT (DISPOSABLE) ×1 IMPLANT
STRIP CLOSURE SKIN 1/2X4 (GAUZE/BANDAGES/DRESSINGS) ×2 IMPLANT
SUCTION FRAZIER HANDLE 10FR (MISCELLANEOUS) ×2
SUCTION TUBE FRAZIER 10FR DISP (MISCELLANEOUS) ×1 IMPLANT
SUT MNCRL AB 3-0 PS2 18 (SUTURE) ×3 IMPLANT
SUT VIC AB 0 CT1 27 (SUTURE) ×6
SUT VIC AB 0 CT1 27XBRD ANBCTR (SUTURE) ×2 IMPLANT
SUT VIC AB 1 CT1 27 (SUTURE) ×3
SUT VIC AB 1 CT1 27XBRD ANBCTR (SUTURE) ×1 IMPLANT
SUT VIC AB 2-0 CT1 27 (SUTURE) ×6
SUT VIC AB 2-0 CT1 TAPERPNT 27 (SUTURE) ×2 IMPLANT
SYR CONTROL 10ML LL (SYRINGE) ×6 IMPLANT
TIBIAL BASE ROT PLAT SZ 5 KNEE (Knees) ×3 IMPLANT
TRAY FOLEY CATH 14FR (SET/KITS/TRAYS/PACK) ×2 IMPLANT
WATER STERILE IRR 1000ML POUR (IV SOLUTION) ×3 IMPLANT

## 2018-06-14 NOTE — Addendum Note (Signed)
Addendum  created 06/14/18 1445 by Julian ReilWelty, Kemonie Cutillo F, CRNA   Intraprocedure Flowsheets edited

## 2018-06-14 NOTE — Progress Notes (Signed)
Patient is maintaining 98% sat levels- non labored resp.  Supplemental oxygen was placed on the patient for comfort measures.  The patient and her husband reports that the patient does have a history of snoring when sleeping and the supplemental oxygen was explained to the patient and was instructed on taking deep breathing exercises.  The patient verbalizes understanding.  Presently the oxygen sat readings are 99% to 100 %

## 2018-06-14 NOTE — Progress Notes (Signed)
Spouse called this nurse to room. Ox sats dropped to 90-91%. Pt observed with shallow breathing. Easily aroused and encouraged to take a deep breath.  Sats immediately up to 96%. At bedside with pt monitoring.

## 2018-06-14 NOTE — Progress Notes (Signed)
Given first dose of dilaudid. Pt states feelings of relief immediately. On continuous pulse ox at 98% with RA. Lung sounds clear bilaterally with symmetrical rise and fall of chest .No allergic reactions observed. Bed in lowest position. Call be in reach. Spouse at bedside.

## 2018-06-14 NOTE — Anesthesia Preprocedure Evaluation (Addendum)
Anesthesia Evaluation  Patient identified by MRN, date of birth, ID band Patient awake    Reviewed: Allergy & Precautions, NPO status , Patient's Chart, lab work & pertinent test results  History of Anesthesia Complications (+) PONV  Airway Mallampati: I  TM Distance: >3 FB Neck ROM: Full    Dental  (+) Dental Advisory Given, Teeth Intact   Pulmonary    Pulmonary exam normal        Cardiovascular Normal cardiovascular exam     Neuro/Psych Depression    GI/Hepatic   Endo/Other    Renal/GU      Musculoskeletal   Abdominal   Peds  Hematology   Anesthesia Other Findings   Reproductive/Obstetrics                            Anesthesia Physical Anesthesia Plan  ASA: II  Anesthesia Plan: Spinal and MAC   Post-op Pain Management:  Regional for Post-op pain   Induction: Intravenous  PONV Risk Score and Plan: 3 and Ondansetron, Dexamethasone and Midazolam  Airway Management Planned: Simple Face Mask  Additional Equipment:   Intra-op Plan:   Post-operative Plan:   Informed Consent: I have reviewed the patients History and Physical, chart, labs and discussed the procedure including the risks, benefits and alternatives for the proposed anesthesia with the patient or authorized representative who has indicated his/her understanding and acceptance.     Plan Discussed with: Surgeon and CRNA  Anesthesia Plan Comments:         Anesthesia Quick Evaluation

## 2018-06-14 NOTE — Anesthesia Postprocedure Evaluation (Signed)
Anesthesia Post Note  Patient: Linward FosterSusan W Hauss  Procedure(s) Performed: TOTAL RIGHT KNEE ARTHROPLASTY (Right Knee)     Patient location during evaluation: PACU Anesthesia Type: Regional Level of consciousness: oriented and awake and alert Pain management: pain level controlled Vital Signs Assessment: post-procedure vital signs reviewed and stable Respiratory status: spontaneous breathing, respiratory function stable and patient connected to nasal cannula oxygen Cardiovascular status: blood pressure returned to baseline and stable Postop Assessment: no headache, no backache and no apparent nausea or vomiting Anesthetic complications: no    Last Vitals:  Vitals:   06/14/18 1045 06/14/18 1115  BP: 132/66 129/75  Pulse: 73 69  Resp: 16 16  Temp: (!) 36.2 C (!) 36.3 C  SpO2: 96% 97%    Last Pain:  Vitals:   06/14/18 1115  TempSrc: Oral  PainSc: 2                  Marialy Urbanczyk DAVID

## 2018-06-14 NOTE — Anesthesia Procedure Notes (Signed)
Procedure Name: MAC Date/Time: 06/14/2018 7:45 AM Performed by: Orlie Dakin, CRNA Pre-anesthesia Checklist: Patient identified, Emergency Drugs available, Suction available and Patient being monitored Patient Re-evaluated:Patient Re-evaluated prior to induction Oxygen Delivery Method: Simple face mask Preoxygenation: Pre-oxygenation with 100% oxygen

## 2018-06-14 NOTE — Transfer of Care (Signed)
Immediate Anesthesia Transfer of Care Note  Patient: [facility]  Procedure(s) Performed: TOTAL RIGHT KNEE ARTHROPLASTY (Right Knee)  Patient Location: PACU  Anesthesia Type:Regional and Spinal  Level of Consciousness: awake, oriented and patient cooperative  Airway & Oxygen Therapy: Patient Spontanous Breathing and Patient connected to face mask oxygen  Post-op Assessment: Report given to RN and Post -op Vital signs reviewed and stable  Post vital signs: Reviewed and stable  Last Vitals:  Vitals Value Taken Time  BP 107/68 06/14/2018  9:12 AM  Temp    Pulse 77 06/14/2018  9:13 AM  Resp 14 06/14/2018  9:13 AM  SpO2 100 % 06/14/2018  9:13 AM  Vitals shown include unvalidated device data.  Last Pain:  Vitals:   06/14/18 0611  TempSrc: Oral  PainSc:          Complications: No apparent anesthesia complications

## 2018-06-14 NOTE — Interval H&P Note (Signed)
History and Physical Interval Note:  06/14/2018 6:24 AM  Linward FosterSusan W Hebner  has presented today for surgery, with the diagnosis of djd right knee  The various methods of treatment have been discussed with the patient and family. After consideration of risks, benefits and other options for treatment, the patient has consented to  Procedure(s): TOTAL KNEE ARTHROPLASTY (Right) as a surgical intervention .  The patient's history has been reviewed, patient examined, no change in status, stable for surgery.  I have reviewed the patient's chart and labs.  Questions were answered to the patient's satisfaction.     Nilda Simmerobert A Bellatrix Devonshire

## 2018-06-14 NOTE — Op Note (Signed)
MRN:     [id number] DOB/AGE:    March 14, 1951 / 67 y.o.       OPERATIVE REPORT   DATE OF PROCEDURE:  06/14/2018      PREOPERATIVE DIAGNOSIS:   Primary Localized Osteoarthritis right Knee       Estimated body mass index is 24.78 kg/m as calculated from the following:   Height as of this encounter: 5\' 8"  (1.727 m).   Weight as of this encounter: 73.9 kg.                                                       POSTOPERATIVE DIAGNOSIS:   Same                                                                 PROCEDURE:  Procedure(s): TOTAL RIGHT KNEE ARTHROPLASTY Using Depuy Attune RP implants #5 Femur, #5Tibia, 6mm  RP bearing, 32 Patella    SURGEON: Rosamae Rocque A. Thurston HoleWainer, MD   ASSISTANT: Julien GirtKirstin Shepperson, PA-C, present and scrubbed throughout the case, critical for retraction, instrumentation, and closure.  ANESTHESIA: Spinal with Adductor Nerve Block  TOURNIQUET TIME: 58 minutes   COMPLICATIONS:  None       SPECIMENS: None   INDICATIONS FOR PROCEDURE: The patient has djd of the knee with varus deformities, XR shows bone on bone arthritis. Patient has failed all conservative measures including anti-inflammatory medicines, narcotics, attempts at exercise and weight loss, cortisone injections and viscosupplementation.  Risks and benefits of surgery have been discussed, questions answered.    DESCRIPTION OF PROCEDURE: The patient identified by armband, received right adductor canal block and IV antibiotics, [facility] Patient taken to the operating room, appropriate anesthetic monitors were attached. Spinal anesthesia induced with the patient in supine position, Foley catheter was inserted. Tourniquet applied high to the operative thigh. Lateral post and foot positioner applied to the table, the lower extremity was then prepped and draped in usual sterile fashion from the ankle to the tourniquet. Time-out procedure was performed. The limb was wrapped with an Esmarch  bandage and the tourniquet inflated to 365 mmHg.   We began the operation by making a 6cm anterior midline incision. Small bleeders in the skin and the subcutaneous tissue identified and cauterized. Transverse retinaculum was incised and reflected medially and a medial parapatellar arthrotomy was accomplished. the patella was everted and theprepatellar fat pad resected. The superficial medial collateral ligament was then elevated from anterior to posterior along the proximal flare of the tibia and anterior half of the menisci resected. The knee was hyperflexed exposing bone on bone arthritis. Peripheral and notch osteophytes as well as the cruciate ligaments were then resected. We continued to work our way around posteriorly along the proximal tibia, and externally rotated the tibia subluxing it out from underneath the femur. A McHale retractor was placed through the notch and a lateral Hohmann retractor placed, and an external tibial guide was placed.  The tibial cutting guide was pinned into place allowing resection of 6 mm of bone medially and about 4 mm of bone laterally because of  her valgus deformity.   Satisfied with the tibial resection, we then entered the distal femur 2 mm anterior to the PCL origin with the intramedullary guide rod and applied the distal femoral cutting guide set at 11mm, with 5 degrees of valgus. This was pinned along the epicondylar axis. At this point, the distal femoral cut was accomplished without difficulty. We then sized for a 5 femoral component and pinned the guide in 3 degrees of external rotation.The chamfer cutting guide was pinned into place. The anterior, posterior, and chamfer cuts were accomplished without difficulty followed by the  RP box cutting guide and the box cut. We also removed posterior osteophytes from the posterior femoral condyles. At this time, the knee was brought into full extension. We checked our extension and flexion gaps and found them symmetric at  6.  The patella thickness measured at 7m m. We set the cutting guide at 15 and removed the posterior patella sized for 32 button and drilled the lollipop. The knee was then once again hyperflexed exposing the proximal tibia. We sized for a # 5 tibial base plate, applied the smokestack and the conical reamer followed by the the Delta fin keel punch. We then hammered into place the  RP trial femoral component, inserted a trial bearing, trial patellar button, and took the knee through range of motion from 0-130 degrees. No thumb pressure was required for patellar tracking.   At this point, all trial components were removed, a double batch of DePuy HV cement  was mixed and applied to all bony metallic mating surfaces. In order, we hammered into place the tibial tray and removed excess cement, the femoral component and removed excess cement, a 6 mm  RP bearing was inserted, and the knee brought to full extension with compression. The patellar button was clamped into place, and excess cement removed. While the cement cured the wound was irrigated out with normal saline solution pulse lavage, and exparel was injected throughout the knee. Ligament stability and patellar tracking were checked and found to be excellent..   The parapatellar arthrotomy was closed with  #1 Vicryl suture. The subcutaneous tissue with 0 and 2-0 undyed Vicryl suture, and 4-0 Monocryl.. A dressing of Aquaseal, 4 x 4, dressing sponges, Webril, and Ace wrap applied. Needle and sponge count were correct times 2.The patient awakened, extubated, and taken to recovery room without difficulty. Vascular status was normal, pulses 2+ and symmetric.    Nilda Simmer 12/21/2017, 8:56 AM

## 2018-06-14 NOTE — Anesthesia Procedure Notes (Signed)
Spinal  Start time: 06/14/2018 7:17 AM End time: 06/14/2018 7:19 AM Staffing Anesthesiologist: Arta Brucessey, Essa Malachi, MD Performed: anesthesiologist  Preanesthetic Checklist Completed: patient identified, surgical consent, pre-op evaluation, timeout performed, IV checked, risks and benefits discussed and monitors and equipment checked Spinal Block Patient position: sitting Prep: ChloraPrep Patient monitoring: heart rate, cardiac monitor, continuous pulse ox and blood pressure Approach: right paramedian Location: L3-4 Injection technique: single-shot Needle Needle type: Pencan  Needle gauge: 24 G Needle length: 9 cm Needle insertion depth: 7 cm

## 2018-06-14 NOTE — Progress Notes (Signed)
Orthopedic Tech Progress Note Patient Details:  Carol FosterSusan W Bell [birth year] [id number]  CPM Right Knee CPM Right Knee: On Right Knee Flexion (Degrees): 90 Right Knee Extension (Degrees): 0 Additional Comments: foot roll  Post Interventions Patient Tolerated: Well Instructions Provided: Care of device, Adjustment of device  Saul FordyceJennifer C Aryiana Klinkner 06/14/2018, 7:12 PM

## 2018-06-14 NOTE — Evaluation (Signed)
Physical Therapy Evaluation Patient Details Name: Carol Bell MRN: [id number] DOB: [birth year] Today's Date: 06/14/2018   History of Present Illness  Pt is a 67 y/o female s/p elective R TKA. PMH includes depression.   Clinical Impression  Pt is s/p surgery above with deficits below. Pt with nausea and lightheadedness, so gait distance limited to chair. Required min to min guard A for mobility using RW. Educated about knee precautions and supine HEP. Will continue to follow acutely to maximize functional mobility independence and safety.     Follow Up Recommendations Follow surgeon's recommendation for DC plan and follow-up therapies;Supervision for mobility/OOB    Equipment Recommendations  None recommended by PT    Recommendations for Other Services OT consult     Precautions / Restrictions Precautions Precautions: Knee Precaution Booklet Issued: Yes (comment) Precaution Comments: Reviewed supine HEP and knee precautions with pt.  Required Braces or Orthoses: Knee Immobilizer - Right Knee Immobilizer - Right: Other (comment)(until good quad control ) Restrictions Weight Bearing Restrictions: Yes RLE Weight Bearing: Weight bearing as tolerated      Mobility  Bed Mobility Overal bed mobility: Needs Assistance Bed Mobility: Supine to Sit     Supine to sit: Supervision     General bed mobility comments: Supervision for safety. Increased time required.   Transfers Overall transfer level: Needs assistance Equipment used: Rolling walker (2 wheeled) Transfers: Sit to/from Stand Sit to Stand: Min assist         General transfer comment: Min A for lift assist and steadying. Verbal cues for safe hand placement.   Ambulation/Gait Ambulation/Gait assistance: Min guard Gait Distance (Feet): 5 Feet Assistive device: Rolling walker (2 wheeled) Gait Pattern/deviations: Step-to pattern;Decreased step length - right;Decreased step length - left;Decreased weight shift to  right;Antalgic Gait velocity: Decreased    General Gait Details: Slow antalgic gait. Verbal cues for sequencing using RW. Pt with nausea and lightheadedness, so distance limited to chair. Symptoms improved with seated rest.   Stairs            Wheelchair Mobility    Modified Rankin (Stroke Patients Only)       Balance Overall balance assessment: Needs assistance Sitting-balance support: No upper extremity supported;Feet supported Sitting balance-Leahy Scale: Good     Standing balance support: Bilateral upper extremity supported;During functional activity Standing balance-Leahy Scale: Poor Standing balance comment: Reliant on BUE support                              Pertinent Vitals/Pain Pain Assessment: 0-10 Pain Score: 5  Pain Location: R knee  Pain Descriptors / Indicators: Aching;Operative site guarding Pain Intervention(s): Limited activity within patient's tolerance;Monitored during session;Repositioned    Home Living Family/patient expects to be discharged to:: Private residence Living Arrangements: Spouse/significant other Available Help at Discharge: Family;Available 24 hours/day Type of Home: House Home Access: Stairs to enter Entrance Stairs-Rails: None Entrance Stairs-Number of Steps: 2 Home Layout: One level Home Equipment: Walker - 2 wheels;Bedside commode;Crutches;Other (comment)(CPM)      Prior Function Level of Independence: Independent               Hand Dominance   Dominant Hand: Right    Extremity/Trunk Assessment   Upper Extremity Assessment Upper Extremity Assessment: Defer to OT evaluation    Lower Extremity Assessment Lower Extremity Assessment: RLE deficits/detail RLE Deficits / Details: Reports some decreased sensation. Able to perform ther ex below. Deficits consistent with post op  pain and weakness.     Cervical / Trunk Assessment Cervical / Trunk Assessment: Normal  Communication   Communication: No  difficulties  Cognition Arousal/Alertness: Awake/alert Behavior During Therapy: WFL for tasks assessed/performed Overall Cognitive Status: Within Functional Limits for tasks assessed                                        General Comments General comments (skin integrity, edema, etc.): Pt's husband present during session.     Exercises Total Joint Exercises Ankle Circles/Pumps: AROM;Both;20 reps Quad Sets: AROM;Right;10 reps Heel Slides: AROM;Right;10 reps   Assessment/Plan    PT Assessment Patient needs continued PT services  PT Problem List Decreased strength;Decreased balance;Decreased mobility;Decreased knowledge of use of DME;Pain;Impaired sensation;Decreased knowledge of precautions;Decreased activity tolerance;Decreased range of motion       PT Treatment Interventions DME instruction;Gait training;Stair training;Functional mobility training;Therapeutic activities;Therapeutic exercise;Balance training;Patient/family education    PT Goals (Current goals can be found in the Care Plan section)  Acute Rehab PT Goals Patient Stated Goal: to go home  PT Goal Formulation: With patient Time For Goal Achievement: 06/28/18 Potential to Achieve Goals: Good    Frequency 7X/week   Barriers to discharge        Co-evaluation               AM-PAC PT "6 Clicks" Daily Activity  Outcome Measure Difficulty turning over in bed (including adjusting bedclothes, sheets and blankets)?: A Little Difficulty moving from lying on back to sitting on the side of the bed? : A Little Difficulty sitting down on and standing up from a chair with arms (e.g., wheelchair, bedside commode, etc,.)?: Unable Help needed moving to and from a bed to chair (including a wheelchair)?: A Little Help needed walking in hospital room?: A Little Help needed climbing 3-5 steps with a railing? : A Lot 6 Click Score: 15    End of Session Equipment Utilized During Treatment: Gait belt;Right  knee immobilizer Activity Tolerance: Treatment limited secondary to medical complications (Comment)(nausea/lightheadedness) Patient left: in chair;with call bell/phone within reach;with family/visitor present Nurse Communication: Mobility status PT Visit Diagnosis: Other abnormalities of gait and mobility (R26.89);Pain Pain - Right/Left: Right Pain - part of body: Knee    Time: 1333-1400 PT Time Calculation (min) (ACUTE ONLY): 27 min   Charges:   PT Evaluation $PT Eval Low Complexity: 1 Low PT Treatments $Therapeutic Activity: 8-22 mins        Gladys DammeBrittany Marilena Trevathan, PT, DPT  Acute Rehabilitation Services  Pager: [phone] Office: [phone]   Lehman PromBrittany S Ledia Hanford 06/14/2018, 4:40 PM

## 2018-06-14 NOTE — Care Management Note (Addendum)
Case Management Note  Patient Details  Name: Carol FosterSusan W Bell MRN: [id number] Date of Birth: 15-Feb-1951  Subjective/Objective:  Pt presented s/p TRKA. Patient has been set up with [facility] via the office.                 Action/Plan: Liaison Tiffany with [facility] will continue to follow the patient post surgery. CM will continue to monitor for DME needs.   Expected Discharge Date:  06/15/18               Expected Discharge Plan:  Home w Home Health Services  In-House Referral:  NA  Discharge planning Services  CM Consult  Post Acute Care Choice:  Home Health, Durable Medical Equipment Choice offered to:  Patient  DME Arranged:   3n1, Rolling Walker, Continuous Passive Motion Machine. DME Agency:    [employer]  HH Arranged:  PT HH Agency:  [facility]  Status of Service: Completed If discussed at [employer] Meetings, dates discussed:    Additional Comments: 1152 06-15-18 Tomi BambergerBrenda [name], RN, BSN Case Manager [phone] CM did speak with patient this am in regards to DME- which has been delivered to home address. Pt has been set up with [facility] and she is fine with that agency of choice. Tiffany will continue to monitor with [facility] and [facility] to begin within 24-48 hours post transition home. Patient has transportation home. No further needs from CM at this time.  Gala LewandowskyGraves-Bigelow, [name], RN 06/14/2018, 3:10 PM

## 2018-06-14 NOTE — Anesthesia Procedure Notes (Addendum)
Anesthesia Regional Block: Adductor canal block   Pre-Anesthetic Checklist: ,, timeout performed, Correct Patient, Correct Site, Correct Laterality, Correct Procedure, Correct Position, site marked, Risks and benefits discussed,  Surgical consent,  Pre-op evaluation,  At surgeon's request and post-op pain management  Laterality: Right  Prep: chloraprep       Needles:  Injection technique: Single-shot  Needle Type: Echogenic Stimulator Needle     Needle Length: 9cm  Needle Gauge: 21     Additional Needles:   Narrative:  Start time: 06/14/2018 6:55 AM End time: 06/14/2018 7:05 AM Injection made incrementally with aspirations every 5 mL.  Performed by: Personally  Anesthesiologist: Arta Brucessey, Shaunna Rosetti, MD  Additional Notes: Monitors applied. Patient sedated. Sterile prep and drape,hand hygiene and sterile gloves were used. Relevant anatomy identified.Needle position confirmed.Local anesthetic injected incrementally after negative aspiration. Local anesthetic spread visualized around nerve(s). Vascular puncture avoided. No complications. Image printed for medical record.The patient tolerated the procedure well.    Arta BruceKevin Mccauley Diehl MD

## 2018-06-15 ENCOUNTER — Encounter (HOSPITAL_COMMUNITY): Payer: Self-pay | Admitting: Orthopedic Surgery

## 2018-06-15 LAB — BASIC METABOLIC PANEL
Anion gap: 9 (ref 5–15)
BUN: 9 mg/dL (ref 8–23)
CALCIUM: 9.1 mg/dL (ref 8.9–10.3)
CO2: 25 mmol/L (ref 22–32)
CREATININE: 0.66 mg/dL (ref 0.44–1.00)
Chloride: 104 mmol/L (ref 98–111)
GFR calc non Af Amer: >60 mL/min (ref >60)
Glucose, Bld: 229 mg/dL — ABNORMAL HIGH (ref 70–99)
Potassium: 4.1 mmol/L (ref 3.5–5.1)
Sodium: 138 mmol/L (ref 135–145)

## 2018-06-15 LAB — CBC
HCT: 35.7 % — ABNORMAL LOW (ref 36.0–46.0)
HEMOGLOBIN: 11.9 g/dL — AB (ref 12.0–15.0)
MCH: 29 pg (ref 26.0–34.0)
MCHC: 33.3 g/dL (ref 30.0–36.0)
MCV: 86.9 fL (ref 78.0–100.0)
Platelets: 199 10*3/uL (ref 150–400)
RBC: 4.11 MIL/uL (ref 3.87–5.11)
RDW: 12.3 % (ref 11.5–15.5)
WBC: 12.8 10*3/uL — ABNORMAL HIGH (ref 4.0–10.5)

## 2018-06-15 MED ORDER — POLYETHYLENE GLYCOL 3350 17 G PO PACK: PACK | ORAL | 0 refills | Status: DC

## 2018-06-15 MED ORDER — ACETAMINOPHEN 500 MG PO TABS: 1000.0000 mg | ORAL_TABLET | Freq: Four times a day (QID) | ORAL | 0 refills | Status: DC | PRN

## 2018-06-15 MED ORDER — IBUPROFEN 200 MG PO TABS: 400.0000 mg | ORAL_TABLET | ORAL | 0 refills | Status: DC | PRN

## 2018-06-15 MED ORDER — ASPIRIN 325 MG PO TBEC: DELAYED_RELEASE_TABLET | ORAL | 0 refills | Status: DC

## 2018-06-15 MED ORDER — DOCUSATE SODIUM 100 MG PO CAPS: ORAL_CAPSULE | ORAL | 0 refills | Status: DC

## 2018-06-15 MED ORDER — GABAPENTIN 600 MG PO TABS: 300.0000 mg | ORAL_TABLET | Freq: Every day | ORAL | 5 refills | Status: DC

## 2018-06-15 MED ORDER — OXYCODONE HCL 5 MG PO TABS: ORAL_TABLET | ORAL | 0 refills | Status: DC

## 2018-06-15 NOTE — Plan of Care (Signed)

## 2018-06-15 NOTE — Progress Notes (Signed)
Physical Therapy Treatment Patient Details Name: Carol FosterSusan W Cannedy MRN: [id number] DOB: 1950/12/23 Today's Date: 06/15/2018    History of Present Illness Pt is a 67 y/o female s/p elective R TKA. PMH includes depression.     PT Comments    Pt is POD #1 and is progressing well with gait and mobility.  Pt was able to walk the unit with RW, do the stairs, and practice her HEP.  I will see her a second session this PM before her anticipated d/c later today.     Follow Up Recommendations  Follow surgeon's recommendation for DC plan and follow-up therapies;Supervision for mobility/OOB     Equipment Recommendations  None recommended by PT    Recommendations for Other Services   NA     Precautions / Restrictions Precautions Precautions: Knee Precaution Booklet Issued: Yes (comment) Precaution Comments: Reviewed supine HEP and knee precautions with pt.  Required Braces or Orthoses: (d/c) Restrictions Weight Bearing Restrictions: Yes RLE Weight Bearing: Weight bearing as tolerated    Mobility  Bed Mobility               General bed mobility comments: Pt was OOB in the recliner chair.   Transfers Overall transfer level: Needs assistance Equipment used: Rolling walker (2 wheeled) Transfers: Sit to/from Stand Sit to Stand: Supervision         General transfer comment: supervision for safety  Ambulation/Gait Ambulation/Gait assistance: Supervision Gait Distance (Feet): 230 Feet Assistive device: Rolling walker (2 wheeled) Gait Pattern/deviations: Step-through pattern;Antalgic     General Gait Details: Pt with mildly antalgic gait pattern, self cueing to look up/upright posture/relaxed shoulders, verbal cues for heel to toe gait pattern.    Stairs Stairs: Yes Stairs assistance: Min guard Stair Management: No rails;Forwards;Step to pattern;With walker Number of Stairs: 2 General stair comments: Visually demonstrated both reverse with RW and forward with RW as pt  reports she has two steps (small) separated by a landing (not consecutive).  She and her husband decided that going forward made more sense, so we practiced forward with RW with min guard assist for safety, cues for LE and RW sequencing.  We will practice again this PM and see if pt can teach back what she learned.           Balance Overall balance assessment: Needs assistance Sitting-balance support: Feet supported;No upper extremity supported Sitting balance-Leahy Scale: Good     Standing balance support: Single extremity supported;No upper extremity supported;Bilateral upper extremity supported Standing balance-Leahy Scale: Fair                              Cognition Arousal/Alertness: Awake/alert Behavior During Therapy: WFL for tasks assessed/performed Overall Cognitive Status: Within Functional Limits for tasks assessed                                        Exercises Total Joint Exercises Ankle Circles/Pumps: AROM;Both;20 reps Quad Sets: AROM;Right;10 reps Towel Squeeze: AROM;Both;10 reps Heel Slides: AAROM;Right;10 reps Long Arc Quad: AROM;Right;10 reps Knee Flexion: AROM;AAROM;Right;10 reps Goniometric ROM: 9-90        Pertinent Vitals/Pain Pain Assessment: Faces Faces Pain Scale: Hurts even more Pain Location: R knee  Pain Descriptors / Indicators: Guarding;Grimacing Pain Intervention(s): Limited activity within patient's tolerance;Monitored during session;Repositioned;Ice applied           PT  Goals (current goals can now be found in the care plan section) Acute Rehab PT Goals Patient Stated Goal: to go home  Progress towards PT goals: Progressing toward goals    Frequency    7X/week      PT Plan Current plan remains appropriate       AM-PAC PT "6 Clicks" Daily Activity  Outcome Measure  Difficulty turning over in bed (including adjusting bedclothes, sheets and blankets)?: A Little Difficulty moving from lying on  back to sitting on the side of the bed? : A Little Difficulty sitting down on and standing up from a chair with arms (e.g., wheelchair, bedside commode, etc,.)?: None Help needed moving to and from a bed to chair (including a wheelchair)?: None Help needed walking in hospital room?: None Help needed climbing 3-5 steps with a railing? : A Little 6 Click Score: 21    End of Session   Activity Tolerance: Patient limited by pain Patient left: in chair;with call bell/phone within reach;with family/visitor present Nurse Communication: Mobility status PT Visit Diagnosis: Other abnormalities of gait and mobility (R26.89);Pain Pain - Right/Left: Right Pain - part of body: Knee     Time: 1610-9604 PT Time Calculation (min) (ACUTE ONLY): 26 min  Charges:  $Gait Training: 8-22 mins $Therapeutic Exercise: 8-22 mins                    Rayna Brenner B. Mackensi Mahadeo, PT, DPT  Acute Rehabilitation [phone] pager #[phone] office   06/15/2018, 11:59 AM

## 2018-06-15 NOTE — Discharge Summary (Signed)
Patient ID: Carol Bell MRN: [id number] DOB/AGE: Feb 21, 1951 67 y.o.  Admit date: 06/14/2018 Discharge date: 06/15/2018  Admission Diagnoses:  Active Problems:   Primary localized osteoarthrosis of the knee, right   Discharge Diagnoses:  Same  Past Medical History:  Diagnosis Date  . Depression   . Family history of BRCA2 gene positive   . Family history of breast cancer   . Family history of ovarian cancer   . Family history of pancreatic cancer   . Family history of prostate cancer   . History of kidney stones   . PONV (postoperative nausea and vomiting)     Surgeries: Procedure(s): TOTAL RIGHT KNEE ARTHROPLASTY on 06/14/2018   Consultants:   Discharged Condition: Improved  Hospital Course: Carol Bell is an 67 y.o. female who was admitted 06/14/2018 for operative treatment of<principal problem not specified>. Patient has severe unremitting pain that affects sleep, daily activities, and work/hobbies. After pre-op clearance the patient was taken to the operating room on 06/14/2018 and underwent  Procedure(s): TOTAL RIGHT KNEE ARTHROPLASTY.    Patient was given perioperative antibiotics:  Anti-infectives (From admission, onward)   Start     Dose/Rate Route Frequency Ordered Stop   06/14/18 1330  ceFAZolin (ANCEF) IVPB 2g/100 mL premix     2 g 200 mL/hr over 30 Minutes Intravenous Every 6 hours 06/14/18 1121 06/14/18 2047   06/14/18 0600  ceFAZolin (ANCEF) IVPB 2g/100 mL premix     2 g 200 mL/hr over 30 Minutes Intravenous On call to O.R. 06/14/18 2376 06/14/18 0736   06/14/18 0523  ceFAZolin (ANCEF) 2-4 GM/100ML-% IVPB    Note to Pharmacy:  Maryjean Ka   : cabinet override      06/14/18 0523 06/14/18 0727       Patient was given sequential compression devices, early ambulation, and chemoprophylaxis to prevent DVT.  Patient benefited maximally from hospital stay and there were no complications.    Recent vital signs:  Patient Vitals for the past 24 hrs:  BP  Temp Temp src Pulse Resp SpO2 Height Weight  06/15/18 0234 110/63 (!) 97.4 F (36.3 C) Axillary 76 18 98 % - -  06/14/18 2329 122/72 (!) 97.5 F (36.4 C) Oral 78 18 98 % - -  06/14/18 1953 120/77 97.7 F (36.5 C) Oral 79 17 98 % - -  06/14/18 1436 - - - - - - 5' 9" (1.753 m) 74.1 kg     Recent laboratory studies:  Recent Labs    06/15/18 0514  WBC 12.8*  HGB 11.9*  HCT 35.7*  PLT 199  NA 138  K 4.1  CL 104  CO2 25  BUN 9  CREATININE 0.66  GLUCOSE 229*  CALCIUM 9.1     Discharge Medications:   Allergies as of 06/15/2018      Reactions   Codeine Nausea Only, Other (See Comments)   "looply"   Other Nausea And Vomiting   general anesthesia       Medication List    STOP taking these medications   diclofenac sodium 1 % Gel Commonly known as:  VOLTAREN   Fish Oil 1000 MG Caps   GLUCOSAMINE-MSM PO   GRAPE SEED EXTRACT PO     TAKE these medications   acetaminophen 500 MG tablet Commonly known as:  TYLENOL Take 2 tablets (1,000 mg total) by mouth every 6 (six) hours as needed for moderate pain. What changed:  when to take this   aspirin 325 MG EC tablet  1 tab a day for the next 30 days to prevent blood clots   buPROPion 300 MG 24 hr tablet Commonly known as:  WELLBUTRIN XL Take 300 mg by mouth daily.   CALCIUM 600+D PO Take 1 tablet by mouth 2 (two) times daily.   docusate sodium 100 MG capsule Commonly known as:  COLACE 1 tab 2 times a day while on narcotics.  STOOL SOFTENER   gabapentin 600 MG tablet Commonly known as:  NEURONTIN Take 0.5 tablets (300 mg total) by mouth at bedtime. For nerve pain   ibuprofen 200 MG tablet Commonly known as:  ADVIL,MOTRIN Take 2 tablets (400 mg total) by mouth every 4 (four) hours as needed for headache or moderate pain (can resume at 10 days after surgery). What changed:  reasons to take this   LUBRICATING EYE DROPS OP Place 1 drop into both eyes daily as needed (dry eyes).   MELATONIN-THEANINE PO Take 1  tablet by mouth at bedtime as needed (sleep).   multivitamin with minerals Tabs tablet Take 1 tablet by mouth daily.   omeprazole 20 MG capsule Commonly known as:  PRILOSEC Take 20 mg by mouth daily.   oxyCODONE 5 MG immediate release tablet Commonly known as:  Oxy IR/ROXICODONE 1 po q 4 hrs prn pain.  Patient had a total knee replacement on 06/14/2018   polyethylene glycol packet Commonly known as:  MIRALAX / GLYCOLAX 17grams in 6 oz of water twice a day until bowel movement.  LAXITIVE.  Restart if two days since last bowel movement            Discharge Care Instructions  (From admission, onward)         Start     Ordered   06/15/18 0000  Change dressing    Comments:  DO NOT REMOVE BANDAGE OVER SURGICAL INCISION.  WASH WHOLE LEG INCLUDING OVER THE WATERPROOF BANDAGE WITH SOAP AND WATER EVERY DAY.   06/15/18 1202          Diagnostic Studies: No results found.  Disposition: Discharge disposition: 01-Home or Self Care       Discharge Instructions    CPM   Complete by:  As directed    Continuous passive motion machine (CPM):      Use the CPM from 0 to 90 for 6 hours per day.       You may break it up into 2 or 3 sessions per day.      Use CPM for 2 weeks or until you are told to stop.   Call MD / Call 911   Complete by:  As directed    If you experience chest pain or shortness of breath, CALL 911 and be transported to the hospital emergency room.  If you develope a fever above 101 F, pus (white drainage) or increased drainage or redness at the wound, or calf pain, call your surgeon's office.   Change dressing   Complete by:  As directed    DO NOT REMOVE BANDAGE OVER SURGICAL INCISION.  WASH WHOLE LEG INCLUDING OVER THE WATERPROOF BANDAGE WITH SOAP AND WATER EVERY DAY.   Constipation Prevention   Complete by:  As directed    Drink plenty of fluids.  Prune juice may be helpful.  You may use a stool softener, such as Colace (over the counter) 100 mg twice a day.   Use MiraLax (over the counter) for constipation as needed.   Diet - low sodium heart healthy   Complete   by:  As directed    Discharge instructions   Complete by:  As directed    INSTRUCTIONS AFTER JOINT REPLACEMENT   Remove items at home which could result in a fall. This includes throw rugs or furniture in walking pathways ICE to the affected joint every three hours while awake for 30 minutes at a time, for at least the first 3-5 days, and then as needed for pain and swelling.  Continue to use ice for pain and swelling. You may notice swelling that will progress down to the foot and ankle.  This is normal after surgery.  Elevate your leg when you are not up walking on it.   Continue to use the breathing machine you got in the hospital (incentive spirometer) which will help keep your temperature down.  It is common for your temperature to cycle up and down following surgery, especially at night when you are not up moving around and exerting yourself.  The breathing machine keeps your lungs expanded and your temperature down.   DIET:  As you were doing prior to hospitalization, we recommend a well-balanced diet.  DRESSING / WOUND CARE / SHOWERING  Keep the surgical dressing until follow up.  The dressing is water proof, so you can shower without any extra covering.  IF THE DRESSING FALLS OFF or the wound gets wet inside, change the dressing with sterile gauze.  Please use good hand washing techniques before changing the dressing.  Do not use any lotions or creams on the incision until instructed by your surgeon.    ACTIVITY  Increase activity slowly as tolerated, but follow the weight bearing instructions below.   No driving for 6 weeks or until further direction given by your physician.  You cannot drive while taking narcotics.  No lifting or carrying greater than 10 lbs. until further directed by your surgeon. Avoid periods of inactivity such as sitting longer than an hour when not asleep.  This helps prevent blood clots.  You may return to work once you are authorized by your doctor.     WEIGHT BEARING   Weight bearing as tolerated with assist device (walker, cane, etc) as directed, use it as long as suggested by your surgeon or therapist, typically at least 2-3 weeks.   EXERCISES  Results after joint replacement surgery are often greatly improved when you follow the exercise, range of motion and muscle strengthening exercises prescribed by your doctor. Safety measures are also important to protect the joint from further injury. Any time any of these exercises cause you to have increased pain or swelling, decrease what you are doing until you are comfortable again and then slowly increase them. If you have problems or questions, call your caregiver or physical therapist for advice.   Rehabilitation is important following a joint replacement. After just a few days of immobilization, the muscles of the leg can become weakened and shrink (atrophy).  These exercises are designed to build up the tone and strength of the thigh and leg muscles and to improve motion. Often times heat used for twenty to thirty minutes before working out will loosen up your tissues and help with improving the range of motion but do not use heat for the first two weeks following surgery (sometimes heat can increase post-operative swelling).   These exercises can be done on a training (exercise) mat, on the floor, on a table or on a bed. Use whatever works the best and is most comfortable for you.      Use music or television while you are exercising so that the exercises are a pleasant break in your day. This will make your life better with the exercises acting as a break in your routine that you can look forward to.   Perform all exercises about fifteen times, three times per day or as directed.  You should exercise both the operative leg and the other leg as well.   Exercises include:  Quad Sets - Tighten up  the muscle on the front of the thigh (Quad) and hold for 5-10 seconds.   Straight Leg Raises - With your knee straight (if you were given a brace, keep it on), lift the leg to 60 degrees, hold for 3 seconds, and slowly lower the leg.  Perform this exercise against resistance later as your leg gets stronger.  Leg Slides: Lying on your back, slowly slide your foot toward your buttocks, bending your knee up off the floor (only go as far as is comfortable). Then slowly slide your foot back down until your leg is flat on the floor again.  Angel Wings: Lying on your back spread your legs to the side as far apart as you can without causing discomfort.  Hamstring Strength:  Lying on your back, push your heel against the floor with your leg straight by tightening up the muscles of your buttocks.  Repeat, but this time bend your knee to a comfortable angle, and push your heel against the floor.  You may put a pillow under the heel to make it more comfortable if necessary.   A rehabilitation program following joint replacement surgery can speed recovery and prevent re-injury in the future due to weakened muscles. Contact your doctor or a physical therapist for more information on knee rehabilitation.    CONSTIPATION  Constipation is defined medically as fewer than three stools per week and severe constipation as less than one stool per week.  Even if you have a regular bowel pattern at home, your normal regimen is likely to be disrupted due to multiple reasons following surgery.  Combination of anesthesia, postoperative narcotics, change in appetite and fluid intake all can affect your bowels.   YOU MUST use at least one of the following options; they are listed in order of increasing strength to get the job done.  They are all available over the counter, and you may need to use some, POSSIBLY even all of these options:    Drink plenty of fluids (prune juice may be helpful) and high fiber foods Colace 100 mg by  mouth twice a day  Senokot for constipation as directed and as needed Dulcolax (bisacodyl), take with full glass of water  Miralax (polyethylene glycol) once or twice a day as needed.  If you have tried all these things and are unable to have a bowel movement in the first 3-4 days after surgery call either your surgeon or your primary doctor.    If you experience loose stools or diarrhea, hold the medications until you stool forms back up.  If your symptoms do not get better within 1 week or if they get worse, check with your doctor.  If you experience "the worst abdominal pain ever" or develop nausea or vomiting, please contact the office immediately for further recommendations for treatment.   ITCHING:  If you experience itching with your medications, try taking only a single pain pill, or even half a pain pill at a time.  You can also use Benadryl over the counter   for itching or also to help with sleep.   TED HOSE STOCKINGS:  Use stockings on both legs until for at least 2 weeks or as directed by physician office. They may be removed at night for sleeping.  MEDICATIONS:  See your medication summary on the "After Visit Summary" that nursing will review with you.  You may have some home medications which will be placed on hold until you complete the course of blood thinner medication.  It is important for you to complete the blood thinner medication as prescribed.  PRECAUTIONS:  If you experience chest pain or shortness of breath - call 911 immediately for transfer to the hospital emergency department.   If you develop a fever greater that 101 F, purulent drainage from wound, increased redness or drainage from wound, foul odor from the wound/dressing, or calf pain - CONTACT YOUR SURGEON.                                                   FOLLOW-UP APPOINTMENTS:  If you do not already have a post-op appointment, please call the office for an appointment to be seen by your surgeon.  Guidelines for  how soon to be seen are listed in your "After Visit Summary", but are typically between 1-4 weeks after surgery.  OTHER INSTRUCTIONS:   Knee Replacement:  Do not place pillow under knee, focus on keeping the knee straight while resting. CPM instructions: 0-90 degrees, 2 hours in the morning, 2 hours in the afternoon, and 2 hours in the evening. Place foam block, curve side up under heel at all times except when in CPM or when walking.  DO NOT modify, tear, cut, or change the foam block in any way.  MAKE SURE YOU:  Understand these instructions.  Get help right away if you are not doing well or get worse.    Thank you for letting us be a part of your medical care team.  It is a privilege we respect greatly.  We hope these instructions will help you stay on track for a fast and full recovery!   Do not put a pillow under the knee. Place it under the heel.   Complete by:  As directed    Place gray foam block, curve side up under heel at all times except when in CPM or when walking.  DO NOT modify, tear, cut, or change in any way the gray foam block.   Increase activity slowly as tolerated   Complete by:  As directed    Patient may shower   Complete by:  As directed    Aquacel dressing is water proof    Wash over it and the whole leg with soap and water at the end of your shower   TED hose   Complete by:  As directed    Use stockings (TED hose) for 2 weeks on both leg(s).  You may remove them at night for sleeping.      Follow-up Information    Wainer, Robert, MD Follow up on 06/28/2018.   Specialty:  Orthopedic Surgery Why:  arrive at 12:30 for 1 pm physical therapy in Dr Wainers office.  Dr Wainer will see you at 2 pm for a post op check. Contact information: [address] [place]  [address] [phone]          [facility] Follow up.   Specialty:  Home Health Services Why:  Physical Therapy- Agency will call to schedule a time to visit.  Contact  information: [address] [place]  [address] [phone]            Signed:  J  06/15/2018, 12:02 PM   

## 2018-06-15 NOTE — Plan of Care (Signed)
  Problem: Activity: Goal: Risk for activity intolerance will decrease Outcome: Progressing   Problem: Pain Managment: Goal: General experience of comfort will improve Outcome: Progressing   

## 2018-06-15 NOTE — Progress Notes (Signed)
Subjective: 1 Day Post-Op Procedure(s) (LRB): TOTAL RIGHT KNEE ARTHROPLASTY (Right) Patient reports pain as 5 on 0-10 scale.    Objective: Vital signs in last 24 hours: Temp:  [97 F (36.1 C)-97.7 F (36.5 C)] 97.4 F (36.3 C) (09/17 0234) Pulse Rate:  [69-79] 76 (09/17 0234) Resp:  [11-18] 18 (09/17 0234) BP: (107-139)/(63-77) 110/63 (09/17 0234) SpO2:  [94 %-99 %] 98 % (09/17 0234) Weight:  [74.1 kg] 74.1 kg (09/16 1436)  Intake/Output from previous day: 09/16 0701 - 09/17 0700 In: 1450 [P.O.:50; I.V.:1400] Out: 3400 [Urine:3400] Intake/Output this shift: No intake/output data recorded.  Recent Labs    06/15/18 0514  HGB 11.9*   Recent Labs    06/15/18 0514  WBC 12.8*  RBC 4.11  HCT 35.7*  PLT 199   Recent Labs    06/15/18 0514  NA 138  K 4.1  CL 104  CO2 25  BUN 9  CREATININE 0.66  GLUCOSE 229*  CALCIUM 9.1   No results for input(s): LABPT, INR in the last 72 hours.  ABD soft Neurovascular intact Sensation intact distally Incision: scant drainage   Patient's anticipated LOS is less than 2 midnights, meeting these requirements: - Younger than 6465 - Lives within 1 hour of care - Has a competent adult at home to recover with post-op recover - NO history of  - Chronic pain requiring opiods  - Diabetes  - Coronary Artery Disease  - Heart failure  - Heart attack  - Stroke  - DVT/VTE  - Cardiac arrhythmia  - Respiratory Failure/COPD  - Renal failure  - Anemia  - Advanced Liver disease      Assessment/Plan: 1 Day Post-Op Procedure(s) (LRB): TOTAL RIGHT KNEE ARTHROPLASTY (Right)  Active Problems:   Primary localized osteoarthrosis of the knee, right   Patient did well with ambulation today.  Will give a 500 cc bolus of NS and then let her go if she does well with PT times two today Advance diet Up with therapy Discharge home with home health    Pascal LuxKirstin J Mendi Constable 06/15/2018, 8:59 AM

## 2018-06-15 NOTE — Progress Notes (Signed)
06/15/2018 Pt is POD #1 and this is her second session.  She was able to recall earlier stair training and demonstrate with ease.  She walked a good distance down the hallway and we completed her HEP program review.  I reinforced no pillow under the bend of her knee rule and assisted her in getting dressed.  Pt and husband are ready for d/c home.  PT to follow acutely until d/c confirmed.   Rollene Rotundaebecca B. Larenz Frasier, PT, DPT  Acute Rehabilitation [phone] pager [phone] office      06/15/18 1422  PT Visit Information  Last PT Received On 06/15/18  Assistance Needed +1  History of Present Illness Pt is a 67 y/o female s/p elective R TKA. PMH includes depression.   Subjective Data  Patient Stated Goal to go home   Precautions  Precautions Knee  Precaution Booklet Issued Yes (comment)  Precaution Comments Reviewed supine HEP and knee precautions with pt.   Required Braces or Orthoses  (d/c)  Restrictions  RLE Weight Bearing WBAT  Pain Assessment  Pain Assessment Faces  Faces Pain Scale 8  Pain Location R knee   Pain Descriptors / Indicators Guarding;Grimacing  Pain Intervention(s) Limited activity within patient's tolerance;Monitored during session;Repositioned  Cognition  Arousal/Alertness Awake/alert  Behavior During Therapy WFL for tasks assessed/performed  Overall Cognitive Status Within Functional Limits for tasks assessed  Bed Mobility  Overal bed mobility Modified Independent  Bed Mobility Sit to Supine  Sit to supine Modified independent (Device/Increase time)  General bed mobility comments using momentum and a bit of extra time needed to complete the task.  Transfers  Overall transfer level Needs assistance  Equipment used Rolling walker (2 wheeled)  Transfers Sit to/from Stand  Sit to Stand Supervision  General transfer comment supervision for safety  Ambulation/Gait  Ambulation/Gait assistance Supervision  Gait Distance (Feet) 230 Feet  Assistive device  Rolling walker (2 wheeled)  Gait Pattern/deviations Step-through pattern;Antalgic  General Gait Details Pt with mildly antalgic gait pattern, verbal cues for heel to toe gait pattern.   Stairs Yes  Stairs assistance Supervision  Stair Management No rails;Forwards;Step to pattern;With walker  Number of Stairs 2  General stair comments Pt able to demonstrate correct LE sequencing and RW use for stair negotiation without cues from PT.    Balance  Overall balance assessment Needs assistance  Sitting-balance support Feet supported;No upper extremity supported  Sitting balance-Leahy Scale Good  Standing balance support Single extremity supported;No upper extremity supported;Bilateral upper extremity supported  Standing balance-Leahy Scale Fair  Exercises  Exercises Total Joint  Total Joint Exercises  Short Arc Quad AROM;Right;10 reps  Hip ABduction/ADduction AROM;Right;10 reps  Straight Leg Raises Right;10 reps;AROM  PT - End of Session  Activity Tolerance Patient limited by pain  Patient left with call bell/phone within reach;with family/visitor present;in bed  Nurse Communication Mobility status  CPM Right Knee  CPM Right Knee Off   PT - Assessment/Plan  PT Plan Current plan remains appropriate  PT Visit Diagnosis Other abnormalities of gait and mobility (R26.89);Pain  Pain - Right/Left Right  Pain - part of body Knee  PT Frequency (ACUTE ONLY) 7X/week  Follow Up Recommendations Follow surgeon's recommendation for DC plan and follow-up therapies;Supervision for mobility/OOB  PT equipment None recommended by PT  AM-PAC PT "6 Clicks" Daily Activity Outcome Measure  Difficulty turning over in bed (including adjusting bedclothes, sheets and blankets)? 4  Difficulty moving from lying on back to sitting on the side of the bed?  4  Difficulty sitting down on and standing up from a chair with arms (e.g., wheelchair, bedside commode, etc,.)? 4  Help needed moving to and from a bed to chair  (including a wheelchair)? 4  Help needed walking in hospital room? 4  Help needed climbing 3-5 steps with a railing?  3  6 Click Score 23  Mobility G Code  CI  PT Goal Progression  Progress towards PT goals Progressing toward goals  PT Time Calculation  PT Start Time (ACUTE ONLY) 1342  PT Stop Time (ACUTE ONLY) 1402  PT Time Calculation (min) (ACUTE ONLY) 20 min  PT General Charges  $$ ACUTE PT VISIT 1 Visit  PT Treatments  $Gait Training 8-22 mins

## 2018-06-15 NOTE — Plan of Care (Signed)
  Problem: Education: Goal: Knowledge of General Education information will improve Description Including pain rating scale, medication(s)/side effects and non-pharmacologic comfort measures 06/15/2018 1356 by Darreld Mcleanox, Wana Mount, RN Outcome: Adequate for Discharge 06/15/2018 1154 by Darreld Mcleanox, Jance Siek, RN Outcome: Progressing   Problem: Health Behavior/Discharge Planning: Goal: Ability to manage health-related needs will improve 06/15/2018 1356 by Darreld Mcleanox, Domonique Cothran, RN Outcome: Adequate for Discharge 06/15/2018 1154 by Darreld Mcleanox, Helyne Genther, RN Outcome: Progressing   Problem: Clinical Measurements: Goal: Ability to maintain clinical measurements within normal limits will improve 06/15/2018 1356 by Darreld Mcleanox, Venisa Frampton, RN Outcome: Adequate for Discharge 06/15/2018 1154 by Darreld Mcleanox, Linkoln Alkire, RN Outcome: Progressing Goal: Will remain free from infection 06/15/2018 1356 by Darreld Mcleanox, Keyton Bhat, RN Outcome: Adequate for Discharge 06/15/2018 1154 by Darreld Mcleanox, Ismelda Weatherman, RN Outcome: Progressing Goal: Diagnostic test results will improve 06/15/2018 1356 by Darreld Mcleanox, Carin Shipp, RN Outcome: Adequate for Discharge 06/15/2018 1154 by Darreld Mcleanox, Jay Kempe, RN Outcome: Progressing Goal: Respiratory complications will improve 06/15/2018 1356 by Darreld Mcleanox, Kameisha Malicki, RN Outcome: Adequate for Discharge 06/15/2018 1154 by Darreld Mcleanox, Da Authement, RN Outcome: Progressing Goal: Cardiovascular complication will be avoided 06/15/2018 1356 by Darreld Mcleanox, Dia Jefferys, RN Outcome: Adequate for Discharge 06/15/2018 1154 by Darreld Mcleanox, Coree Brame, RN Outcome: Progressing   Problem: Activity: Goal: Risk for activity intolerance will decrease 06/15/2018 1356 by Darreld Mcleanox, Christion Leonhard, RN Outcome: Adequate for Discharge 06/15/2018 1154 by Darreld Mcleanox, Aleicia Kenagy, RN Outcome: Progressing   Problem: Nutrition: Goal: Adequate nutrition will be maintained 06/15/2018 1356 by Darreld Mcleanox, Husam Hohn, RN Outcome: Adequate for Discharge 06/15/2018 1154 by Darreld Mcleanox, Laiza Veenstra, RN Outcome: Progressing   Problem: Coping: Goal: Level of anxiety will decrease 06/15/2018 1356 by Darreld Mcleanox, Olisa Quesnel, RN Outcome: Adequate for  Discharge 06/15/2018 1154 by Darreld Mcleanox, Wateen Varon, RN Outcome: Progressing   Problem: Elimination: Goal: Will not experience complications related to bowel motility 06/15/2018 1356 by Darreld Mcleanox, Nefertari Rebman, RN Outcome: Adequate for Discharge 06/15/2018 1154 by Darreld Mcleanox, Emmi Wertheim, RN Outcome: Progressing Goal: Will not experience complications related to urinary retention 06/15/2018 1356 by Darreld Mcleanox, Bethan Adamek, RN Outcome: Adequate for Discharge 06/15/2018 1154 by Darreld Mcleanox, Aiyla Baucom, RN Outcome: Progressing   Problem: Pain Managment: Goal: General experience of comfort will improve 06/15/2018 1356 by Darreld Mcleanox, Kalinda Romaniello, RN Outcome: Adequate for Discharge 06/15/2018 1154 by Darreld Mcleanox, Kailah Pennel, RN Outcome: Progressing   Problem: Safety: Goal: Ability to remain free from injury will improve 06/15/2018 1356 by Darreld Mcleanox, Christl Fessenden, RN Outcome: Adequate for Discharge 06/15/2018 1154 by Darreld Mcleanox, Kalen Neidert, RN Outcome: Progressing   Problem: Skin Integrity: Goal: Risk for impaired skin integrity will decrease 06/15/2018 1356 by Darreld Mcleanox, Guliana Weyandt, RN Outcome: Adequate for Discharge 06/15/2018 1154 by Darreld Mcleanox, Delson Dulworth, RN Outcome: Progressing

## 2018-06-17 DIAGNOSIS — Z96651 Presence of right artificial knee joint: Secondary | ICD-10-CM | POA: Diagnosis not present

## 2018-06-17 DIAGNOSIS — Z9181 History of falling: Secondary | ICD-10-CM | POA: Diagnosis not present

## 2018-06-17 DIAGNOSIS — M810 Age-related osteoporosis without current pathological fracture: Secondary | ICD-10-CM | POA: Diagnosis not present

## 2018-06-17 DIAGNOSIS — Z471 Aftercare following joint replacement surgery: Secondary | ICD-10-CM | POA: Diagnosis not present

## 2018-06-17 DIAGNOSIS — F329 Major depressive disorder, single episode, unspecified: Secondary | ICD-10-CM | POA: Diagnosis not present

## 2018-06-17 DIAGNOSIS — Z7982 Long term (current) use of aspirin: Secondary | ICD-10-CM | POA: Diagnosis not present

## 2018-06-22 DIAGNOSIS — R8290 Unspecified abnormal findings in urine: Secondary | ICD-10-CM | POA: Diagnosis not present

## 2018-06-22 DIAGNOSIS — N390 Urinary tract infection, site not specified: Secondary | ICD-10-CM | POA: Diagnosis not present

## 2018-06-22 DIAGNOSIS — M1711 Unilateral primary osteoarthritis, right knee: Secondary | ICD-10-CM | POA: Diagnosis not present

## 2018-06-28 DIAGNOSIS — R269 Unspecified abnormalities of gait and mobility: Secondary | ICD-10-CM | POA: Diagnosis not present

## 2018-06-28 DIAGNOSIS — M6281 Muscle weakness (generalized): Secondary | ICD-10-CM | POA: Diagnosis not present

## 2018-06-28 DIAGNOSIS — M25661 Stiffness of right knee, not elsewhere classified: Secondary | ICD-10-CM | POA: Diagnosis not present

## 2018-06-28 DIAGNOSIS — M1711 Unilateral primary osteoarthritis, right knee: Secondary | ICD-10-CM | POA: Diagnosis not present

## 2018-06-30 DIAGNOSIS — R269 Unspecified abnormalities of gait and mobility: Secondary | ICD-10-CM | POA: Diagnosis not present

## 2018-06-30 DIAGNOSIS — M1711 Unilateral primary osteoarthritis, right knee: Secondary | ICD-10-CM | POA: Diagnosis not present

## 2018-06-30 DIAGNOSIS — M6281 Muscle weakness (generalized): Secondary | ICD-10-CM | POA: Diagnosis not present

## 2018-06-30 DIAGNOSIS — M25661 Stiffness of right knee, not elsewhere classified: Secondary | ICD-10-CM | POA: Diagnosis not present

## 2018-07-02 DIAGNOSIS — M25661 Stiffness of right knee, not elsewhere classified: Secondary | ICD-10-CM | POA: Diagnosis not present

## 2018-07-02 DIAGNOSIS — R269 Unspecified abnormalities of gait and mobility: Secondary | ICD-10-CM | POA: Diagnosis not present

## 2018-07-02 DIAGNOSIS — M1711 Unilateral primary osteoarthritis, right knee: Secondary | ICD-10-CM | POA: Diagnosis not present

## 2018-07-02 DIAGNOSIS — M6281 Muscle weakness (generalized): Secondary | ICD-10-CM | POA: Diagnosis not present

## 2018-07-05 DIAGNOSIS — M6281 Muscle weakness (generalized): Secondary | ICD-10-CM | POA: Diagnosis not present

## 2018-07-05 DIAGNOSIS — M25661 Stiffness of right knee, not elsewhere classified: Secondary | ICD-10-CM | POA: Diagnosis not present

## 2018-07-05 DIAGNOSIS — R269 Unspecified abnormalities of gait and mobility: Secondary | ICD-10-CM | POA: Diagnosis not present

## 2018-07-05 DIAGNOSIS — M1711 Unilateral primary osteoarthritis, right knee: Secondary | ICD-10-CM | POA: Diagnosis not present

## 2018-07-07 DIAGNOSIS — R269 Unspecified abnormalities of gait and mobility: Secondary | ICD-10-CM | POA: Diagnosis not present

## 2018-07-07 DIAGNOSIS — M1711 Unilateral primary osteoarthritis, right knee: Secondary | ICD-10-CM | POA: Diagnosis not present

## 2018-07-07 DIAGNOSIS — M25661 Stiffness of right knee, not elsewhere classified: Secondary | ICD-10-CM | POA: Diagnosis not present

## 2018-07-07 DIAGNOSIS — M6281 Muscle weakness (generalized): Secondary | ICD-10-CM | POA: Diagnosis not present

## 2018-07-09 DIAGNOSIS — M6281 Muscle weakness (generalized): Secondary | ICD-10-CM | POA: Diagnosis not present

## 2018-07-09 DIAGNOSIS — Z96651 Presence of right artificial knee joint: Secondary | ICD-10-CM | POA: Diagnosis not present

## 2018-07-09 DIAGNOSIS — M25661 Stiffness of right knee, not elsewhere classified: Secondary | ICD-10-CM | POA: Diagnosis not present

## 2018-07-09 DIAGNOSIS — M1711 Unilateral primary osteoarthritis, right knee: Secondary | ICD-10-CM | POA: Diagnosis not present

## 2018-07-13 DIAGNOSIS — R269 Unspecified abnormalities of gait and mobility: Secondary | ICD-10-CM | POA: Diagnosis not present

## 2018-07-13 DIAGNOSIS — M6281 Muscle weakness (generalized): Secondary | ICD-10-CM | POA: Diagnosis not present

## 2018-07-13 DIAGNOSIS — M1711 Unilateral primary osteoarthritis, right knee: Secondary | ICD-10-CM | POA: Diagnosis not present

## 2018-07-13 DIAGNOSIS — M25661 Stiffness of right knee, not elsewhere classified: Secondary | ICD-10-CM | POA: Diagnosis not present

## 2018-07-15 DIAGNOSIS — M25661 Stiffness of right knee, not elsewhere classified: Secondary | ICD-10-CM | POA: Diagnosis not present

## 2018-07-15 DIAGNOSIS — M1711 Unilateral primary osteoarthritis, right knee: Secondary | ICD-10-CM | POA: Diagnosis not present

## 2018-07-15 DIAGNOSIS — R269 Unspecified abnormalities of gait and mobility: Secondary | ICD-10-CM | POA: Diagnosis not present

## 2018-07-15 DIAGNOSIS — M6281 Muscle weakness (generalized): Secondary | ICD-10-CM | POA: Diagnosis not present

## 2018-07-20 DIAGNOSIS — M1711 Unilateral primary osteoarthritis, right knee: Secondary | ICD-10-CM | POA: Diagnosis not present

## 2018-07-20 DIAGNOSIS — M6281 Muscle weakness (generalized): Secondary | ICD-10-CM | POA: Diagnosis not present

## 2018-07-20 DIAGNOSIS — Z96651 Presence of right artificial knee joint: Secondary | ICD-10-CM | POA: Diagnosis not present

## 2018-07-20 DIAGNOSIS — R739 Hyperglycemia, unspecified: Secondary | ICD-10-CM | POA: Diagnosis not present

## 2018-07-20 DIAGNOSIS — M25661 Stiffness of right knee, not elsewhere classified: Secondary | ICD-10-CM | POA: Diagnosis not present

## 2018-07-22 DIAGNOSIS — Z96651 Presence of right artificial knee joint: Secondary | ICD-10-CM | POA: Diagnosis not present

## 2018-07-22 DIAGNOSIS — M1711 Unilateral primary osteoarthritis, right knee: Secondary | ICD-10-CM | POA: Diagnosis not present

## 2018-07-22 DIAGNOSIS — M25661 Stiffness of right knee, not elsewhere classified: Secondary | ICD-10-CM | POA: Diagnosis not present

## 2018-07-22 DIAGNOSIS — M6281 Muscle weakness (generalized): Secondary | ICD-10-CM | POA: Diagnosis not present

## 2018-07-26 DIAGNOSIS — M25661 Stiffness of right knee, not elsewhere classified: Secondary | ICD-10-CM | POA: Diagnosis not present

## 2018-07-26 DIAGNOSIS — Z96651 Presence of right artificial knee joint: Secondary | ICD-10-CM | POA: Diagnosis not present

## 2018-07-26 DIAGNOSIS — M1711 Unilateral primary osteoarthritis, right knee: Secondary | ICD-10-CM | POA: Diagnosis not present

## 2018-07-26 DIAGNOSIS — M6281 Muscle weakness (generalized): Secondary | ICD-10-CM | POA: Diagnosis not present

## 2018-07-27 DIAGNOSIS — M1711 Unilateral primary osteoarthritis, right knee: Secondary | ICD-10-CM | POA: Diagnosis not present

## 2018-07-28 DIAGNOSIS — M1711 Unilateral primary osteoarthritis, right knee: Secondary | ICD-10-CM | POA: Diagnosis not present

## 2018-07-28 DIAGNOSIS — R269 Unspecified abnormalities of gait and mobility: Secondary | ICD-10-CM | POA: Diagnosis not present

## 2018-07-28 DIAGNOSIS — M25661 Stiffness of right knee, not elsewhere classified: Secondary | ICD-10-CM | POA: Diagnosis not present

## 2018-07-28 DIAGNOSIS — M6281 Muscle weakness (generalized): Secondary | ICD-10-CM | POA: Diagnosis not present

## 2018-08-02 DIAGNOSIS — M6281 Muscle weakness (generalized): Secondary | ICD-10-CM | POA: Diagnosis not present

## 2018-08-02 DIAGNOSIS — Z96651 Presence of right artificial knee joint: Secondary | ICD-10-CM | POA: Diagnosis not present

## 2018-08-02 DIAGNOSIS — M25661 Stiffness of right knee, not elsewhere classified: Secondary | ICD-10-CM | POA: Diagnosis not present

## 2018-08-02 DIAGNOSIS — M1711 Unilateral primary osteoarthritis, right knee: Secondary | ICD-10-CM | POA: Diagnosis not present

## 2018-08-04 DIAGNOSIS — R269 Unspecified abnormalities of gait and mobility: Secondary | ICD-10-CM | POA: Diagnosis not present

## 2018-08-04 DIAGNOSIS — M6281 Muscle weakness (generalized): Secondary | ICD-10-CM | POA: Diagnosis not present

## 2018-08-04 DIAGNOSIS — M1711 Unilateral primary osteoarthritis, right knee: Secondary | ICD-10-CM | POA: Diagnosis not present

## 2018-08-04 DIAGNOSIS — M25661 Stiffness of right knee, not elsewhere classified: Secondary | ICD-10-CM | POA: Diagnosis not present

## 2018-08-09 DIAGNOSIS — M1711 Unilateral primary osteoarthritis, right knee: Secondary | ICD-10-CM | POA: Diagnosis not present

## 2018-08-09 DIAGNOSIS — M6281 Muscle weakness (generalized): Secondary | ICD-10-CM | POA: Diagnosis not present

## 2018-08-09 DIAGNOSIS — M25661 Stiffness of right knee, not elsewhere classified: Secondary | ICD-10-CM | POA: Diagnosis not present

## 2018-08-09 DIAGNOSIS — Z96651 Presence of right artificial knee joint: Secondary | ICD-10-CM | POA: Diagnosis not present

## 2018-08-11 DIAGNOSIS — M1711 Unilateral primary osteoarthritis, right knee: Secondary | ICD-10-CM | POA: Diagnosis not present

## 2018-08-11 DIAGNOSIS — Z96651 Presence of right artificial knee joint: Secondary | ICD-10-CM | POA: Diagnosis not present

## 2018-08-11 DIAGNOSIS — M25661 Stiffness of right knee, not elsewhere classified: Secondary | ICD-10-CM | POA: Diagnosis not present

## 2018-08-11 DIAGNOSIS — M6281 Muscle weakness (generalized): Secondary | ICD-10-CM | POA: Diagnosis not present

## 2018-08-16 DIAGNOSIS — M1711 Unilateral primary osteoarthritis, right knee: Secondary | ICD-10-CM | POA: Diagnosis not present

## 2018-08-16 DIAGNOSIS — Z96651 Presence of right artificial knee joint: Secondary | ICD-10-CM | POA: Diagnosis not present

## 2018-08-16 DIAGNOSIS — M25661 Stiffness of right knee, not elsewhere classified: Secondary | ICD-10-CM | POA: Diagnosis not present

## 2018-08-16 DIAGNOSIS — M6281 Muscle weakness (generalized): Secondary | ICD-10-CM | POA: Diagnosis not present

## 2018-08-18 DIAGNOSIS — M6281 Muscle weakness (generalized): Secondary | ICD-10-CM | POA: Diagnosis not present

## 2018-08-18 DIAGNOSIS — M1711 Unilateral primary osteoarthritis, right knee: Secondary | ICD-10-CM | POA: Diagnosis not present

## 2018-08-18 DIAGNOSIS — R269 Unspecified abnormalities of gait and mobility: Secondary | ICD-10-CM | POA: Diagnosis not present

## 2018-08-18 DIAGNOSIS — M25661 Stiffness of right knee, not elsewhere classified: Secondary | ICD-10-CM | POA: Diagnosis not present

## 2018-08-23 DIAGNOSIS — Z96651 Presence of right artificial knee joint: Secondary | ICD-10-CM | POA: Diagnosis not present

## 2018-08-23 DIAGNOSIS — M25661 Stiffness of right knee, not elsewhere classified: Secondary | ICD-10-CM | POA: Diagnosis not present

## 2018-08-23 DIAGNOSIS — M6281 Muscle weakness (generalized): Secondary | ICD-10-CM | POA: Diagnosis not present

## 2018-08-23 DIAGNOSIS — M1711 Unilateral primary osteoarthritis, right knee: Secondary | ICD-10-CM | POA: Diagnosis not present

## 2018-08-24 DIAGNOSIS — M1711 Unilateral primary osteoarthritis, right knee: Secondary | ICD-10-CM | POA: Diagnosis not present

## 2018-08-30 DIAGNOSIS — E118 Type 2 diabetes mellitus with unspecified complications: Secondary | ICD-10-CM | POA: Diagnosis not present

## 2018-08-30 DIAGNOSIS — K219 Gastro-esophageal reflux disease without esophagitis: Secondary | ICD-10-CM | POA: Diagnosis not present

## 2018-08-30 DIAGNOSIS — E119 Type 2 diabetes mellitus without complications: Secondary | ICD-10-CM | POA: Diagnosis not present

## 2018-08-30 DIAGNOSIS — N200 Calculus of kidney: Secondary | ICD-10-CM | POA: Diagnosis not present

## 2018-08-30 DIAGNOSIS — K635 Polyp of colon: Secondary | ICD-10-CM | POA: Diagnosis not present

## 2018-08-31 DIAGNOSIS — M25661 Stiffness of right knee, not elsewhere classified: Secondary | ICD-10-CM | POA: Diagnosis not present

## 2018-08-31 DIAGNOSIS — M1711 Unilateral primary osteoarthritis, right knee: Secondary | ICD-10-CM | POA: Diagnosis not present

## 2018-08-31 DIAGNOSIS — M6281 Muscle weakness (generalized): Secondary | ICD-10-CM | POA: Diagnosis not present

## 2018-08-31 DIAGNOSIS — R269 Unspecified abnormalities of gait and mobility: Secondary | ICD-10-CM | POA: Diagnosis not present

## 2018-09-02 ENCOUNTER — Other Ambulatory Visit: Payer: Self-pay | Admitting: Internal Medicine

## 2018-09-02 DIAGNOSIS — Z1231 Encounter for screening mammogram for malignant neoplasm of breast: Secondary | ICD-10-CM

## 2018-09-07 DIAGNOSIS — M6281 Muscle weakness (generalized): Secondary | ICD-10-CM | POA: Diagnosis not present

## 2018-09-07 DIAGNOSIS — R269 Unspecified abnormalities of gait and mobility: Secondary | ICD-10-CM | POA: Diagnosis not present

## 2018-09-07 DIAGNOSIS — M25661 Stiffness of right knee, not elsewhere classified: Secondary | ICD-10-CM | POA: Diagnosis not present

## 2018-09-07 DIAGNOSIS — M1711 Unilateral primary osteoarthritis, right knee: Secondary | ICD-10-CM | POA: Diagnosis not present

## 2018-09-13 DIAGNOSIS — M6281 Muscle weakness (generalized): Secondary | ICD-10-CM | POA: Diagnosis not present

## 2018-09-13 DIAGNOSIS — M1711 Unilateral primary osteoarthritis, right knee: Secondary | ICD-10-CM | POA: Diagnosis not present

## 2018-09-13 DIAGNOSIS — Z96651 Presence of right artificial knee joint: Secondary | ICD-10-CM | POA: Diagnosis not present

## 2018-09-13 DIAGNOSIS — M25661 Stiffness of right knee, not elsewhere classified: Secondary | ICD-10-CM | POA: Diagnosis not present

## 2018-09-21 DIAGNOSIS — M1711 Unilateral primary osteoarthritis, right knee: Secondary | ICD-10-CM | POA: Diagnosis not present

## 2018-09-23 DIAGNOSIS — R269 Unspecified abnormalities of gait and mobility: Secondary | ICD-10-CM | POA: Diagnosis not present

## 2018-09-23 DIAGNOSIS — M6281 Muscle weakness (generalized): Secondary | ICD-10-CM | POA: Diagnosis not present

## 2018-09-23 DIAGNOSIS — M1711 Unilateral primary osteoarthritis, right knee: Secondary | ICD-10-CM | POA: Diagnosis not present

## 2018-09-23 DIAGNOSIS — M25661 Stiffness of right knee, not elsewhere classified: Secondary | ICD-10-CM | POA: Diagnosis not present

## 2018-09-30 DIAGNOSIS — Z96651 Presence of right artificial knee joint: Secondary | ICD-10-CM | POA: Diagnosis not present

## 2018-10-04 DIAGNOSIS — M6281 Muscle weakness (generalized): Secondary | ICD-10-CM | POA: Diagnosis not present

## 2018-10-04 DIAGNOSIS — M1711 Unilateral primary osteoarthritis, right knee: Secondary | ICD-10-CM | POA: Diagnosis not present

## 2018-10-04 DIAGNOSIS — Z96651 Presence of right artificial knee joint: Secondary | ICD-10-CM | POA: Diagnosis not present

## 2018-10-04 DIAGNOSIS — M25661 Stiffness of right knee, not elsewhere classified: Secondary | ICD-10-CM | POA: Diagnosis not present

## 2018-10-07 DIAGNOSIS — M1711 Unilateral primary osteoarthritis, right knee: Secondary | ICD-10-CM | POA: Diagnosis not present

## 2018-10-07 DIAGNOSIS — M25661 Stiffness of right knee, not elsewhere classified: Secondary | ICD-10-CM | POA: Diagnosis not present

## 2018-10-07 DIAGNOSIS — M6281 Muscle weakness (generalized): Secondary | ICD-10-CM | POA: Diagnosis not present

## 2018-10-07 DIAGNOSIS — Z96651 Presence of right artificial knee joint: Secondary | ICD-10-CM | POA: Diagnosis not present

## 2018-10-11 DIAGNOSIS — M1711 Unilateral primary osteoarthritis, right knee: Secondary | ICD-10-CM | POA: Diagnosis not present

## 2018-10-11 DIAGNOSIS — Z96651 Presence of right artificial knee joint: Secondary | ICD-10-CM | POA: Diagnosis not present

## 2018-10-11 DIAGNOSIS — M6281 Muscle weakness (generalized): Secondary | ICD-10-CM | POA: Diagnosis not present

## 2018-10-11 DIAGNOSIS — M25661 Stiffness of right knee, not elsewhere classified: Secondary | ICD-10-CM | POA: Diagnosis not present

## 2018-10-13 ENCOUNTER — Ambulatory Visit
Admission: RE | Admit: 2018-10-13 | Discharge: 2018-10-13 | Disposition: A | Discharge status: Home / Self Care | Payer: PPO | Source: Ambulatory Visit | Attending: Internal Medicine | Admitting: Internal Medicine

## 2018-10-13 DIAGNOSIS — Z1231 Encounter for screening mammogram for malignant neoplasm of breast: Secondary | ICD-10-CM | POA: Diagnosis not present

## 2018-10-26 DIAGNOSIS — M1711 Unilateral primary osteoarthritis, right knee: Secondary | ICD-10-CM | POA: Diagnosis not present

## 2018-10-31 DIAGNOSIS — Z96651 Presence of right artificial knee joint: Secondary | ICD-10-CM | POA: Diagnosis not present

## 2018-11-25 DIAGNOSIS — J40 Bronchitis, not specified as acute or chronic: Secondary | ICD-10-CM | POA: Diagnosis not present

## 2018-11-25 DIAGNOSIS — J019 Acute sinusitis, unspecified: Secondary | ICD-10-CM | POA: Diagnosis not present

## 2019-02-14 DIAGNOSIS — M7581 Other shoulder lesions, right shoulder: Secondary | ICD-10-CM | POA: Diagnosis not present

## 2019-02-14 DIAGNOSIS — M7582 Other shoulder lesions, left shoulder: Secondary | ICD-10-CM | POA: Diagnosis not present

## 2019-02-24 DIAGNOSIS — E118 Type 2 diabetes mellitus with unspecified complications: Secondary | ICD-10-CM | POA: Diagnosis not present

## 2019-02-24 DIAGNOSIS — E119 Type 2 diabetes mellitus without complications: Secondary | ICD-10-CM | POA: Diagnosis not present

## 2019-02-28 DIAGNOSIS — E119 Type 2 diabetes mellitus without complications: Secondary | ICD-10-CM | POA: Diagnosis not present

## 2019-06-10 DIAGNOSIS — S86099A Other specified injury of unspecified Achilles tendon, initial encounter: Secondary | ICD-10-CM | POA: Diagnosis not present

## 2019-06-10 DIAGNOSIS — Z23 Encounter for immunization: Secondary | ICD-10-CM | POA: Diagnosis not present

## 2019-06-10 DIAGNOSIS — E119 Type 2 diabetes mellitus without complications: Secondary | ICD-10-CM | POA: Diagnosis not present

## 2019-06-10 DIAGNOSIS — M25519 Pain in unspecified shoulder: Secondary | ICD-10-CM | POA: Diagnosis not present

## 2019-06-20 DIAGNOSIS — Z96651 Presence of right artificial knee joint: Secondary | ICD-10-CM | POA: Diagnosis not present

## 2019-06-20 DIAGNOSIS — M7661 Achilles tendinitis, right leg: Secondary | ICD-10-CM | POA: Diagnosis not present

## 2019-08-26 DIAGNOSIS — E119 Type 2 diabetes mellitus without complications: Secondary | ICD-10-CM | POA: Diagnosis not present

## 2019-09-01 DIAGNOSIS — K635 Polyp of colon: Secondary | ICD-10-CM | POA: Diagnosis not present

## 2019-09-01 DIAGNOSIS — N200 Calculus of kidney: Secondary | ICD-10-CM | POA: Diagnosis not present

## 2019-09-01 DIAGNOSIS — Z1211 Encounter for screening for malignant neoplasm of colon: Secondary | ICD-10-CM | POA: Diagnosis not present

## 2019-09-01 DIAGNOSIS — E119 Type 2 diabetes mellitus without complications: Secondary | ICD-10-CM | POA: Diagnosis not present

## 2019-09-01 DIAGNOSIS — E118 Type 2 diabetes mellitus with unspecified complications: Secondary | ICD-10-CM | POA: Diagnosis not present

## 2019-09-01 DIAGNOSIS — I10 Essential (primary) hypertension: Secondary | ICD-10-CM | POA: Diagnosis not present

## 2019-09-02 ENCOUNTER — Other Ambulatory Visit: Payer: Self-pay | Admitting: Internal Medicine

## 2019-09-02 DIAGNOSIS — Z1231 Encounter for screening mammogram for malignant neoplasm of breast: Secondary | ICD-10-CM

## 2019-10-12 DIAGNOSIS — F418 Other specified anxiety disorders: Secondary | ICD-10-CM | POA: Diagnosis not present

## 2019-10-12 DIAGNOSIS — E119 Type 2 diabetes mellitus without complications: Secondary | ICD-10-CM | POA: Diagnosis not present

## 2019-10-12 DIAGNOSIS — I700 Atherosclerosis of aorta: Secondary | ICD-10-CM | POA: Diagnosis not present

## 2019-10-12 DIAGNOSIS — K219 Gastro-esophageal reflux disease without esophagitis: Secondary | ICD-10-CM | POA: Diagnosis not present

## 2019-10-12 DIAGNOSIS — E782 Mixed hyperlipidemia: Secondary | ICD-10-CM | POA: Diagnosis not present

## 2019-10-14 ENCOUNTER — Other Ambulatory Visit: Payer: Self-pay | Admitting: Internal Medicine

## 2019-10-14 DIAGNOSIS — M8580 Other specified disorders of bone density and structure, unspecified site: Secondary | ICD-10-CM

## 2019-10-24 ENCOUNTER — Ambulatory Visit: Payer: PPO

## 2019-11-24 ENCOUNTER — Ambulatory Visit: Payer: PPO

## 2019-12-13 DIAGNOSIS — F418 Other specified anxiety disorders: Secondary | ICD-10-CM | POA: Diagnosis not present

## 2019-12-13 DIAGNOSIS — E119 Type 2 diabetes mellitus without complications: Secondary | ICD-10-CM | POA: Diagnosis not present

## 2019-12-13 DIAGNOSIS — M859 Disorder of bone density and structure, unspecified: Secondary | ICD-10-CM | POA: Diagnosis not present

## 2019-12-13 DIAGNOSIS — M25559 Pain in unspecified hip: Secondary | ICD-10-CM | POA: Diagnosis not present

## 2019-12-13 DIAGNOSIS — E782 Mixed hyperlipidemia: Secondary | ICD-10-CM | POA: Diagnosis not present

## 2019-12-20 ENCOUNTER — Ambulatory Visit
Admission: RE | Admit: 2019-12-20 | Discharge: 2019-12-20 | Disposition: A | Discharge status: Home / Self Care | Payer: PPO | Source: Ambulatory Visit | Attending: Internal Medicine | Admitting: Internal Medicine

## 2019-12-20 ENCOUNTER — Other Ambulatory Visit: Payer: Self-pay

## 2019-12-20 ENCOUNTER — Other Ambulatory Visit: Payer: Self-pay | Admitting: Internal Medicine

## 2019-12-20 DIAGNOSIS — Z1231 Encounter for screening mammogram for malignant neoplasm of breast: Secondary | ICD-10-CM

## 2020-01-02 ENCOUNTER — Other Ambulatory Visit: Payer: Self-pay

## 2020-01-02 ENCOUNTER — Ambulatory Visit
Admission: RE | Admit: 2020-01-02 | Discharge: 2020-01-02 | Disposition: A | Discharge status: Home / Self Care | Payer: PPO | Source: Ambulatory Visit | Attending: Internal Medicine | Admitting: Internal Medicine

## 2020-01-02 DIAGNOSIS — M8580 Other specified disorders of bone density and structure, unspecified site: Secondary | ICD-10-CM

## 2020-01-02 DIAGNOSIS — M85852 Other specified disorders of bone density and structure, left thigh: Secondary | ICD-10-CM | POA: Diagnosis not present

## 2020-01-02 DIAGNOSIS — Z780 Asymptomatic menopausal state: Secondary | ICD-10-CM | POA: Diagnosis not present

## 2020-01-23 ENCOUNTER — Encounter: Payer: PPO | Attending: Internal Medicine | Admitting: Skilled Nursing Facility1

## 2020-01-23 ENCOUNTER — Other Ambulatory Visit: Payer: Self-pay

## 2020-01-23 ENCOUNTER — Encounter: Payer: Self-pay | Admitting: Skilled Nursing Facility1

## 2020-01-23 DIAGNOSIS — E119 Type 2 diabetes mellitus without complications: Secondary | ICD-10-CM | POA: Diagnosis not present

## 2020-01-23 NOTE — Progress Notes (Signed)
Diabetes Self-Management Education  Visit Type: First/Initial  01/23/2020  [name], identified by name and date of birth, is a 69 y.o. female with a diagnosis of Diabetes: Type 2.   ASSESSMENT  Height 5\' 9"  (1.753 m), weight 161 lb (73 kg). Body mass index is 23.78 kg/m.   Pt states the metformin is upsetting her stomach and making her feel fatigued.  Pt states she is checking her blood sugars fasting 2 times a week: 124, 127.  Diabetes Self-Management Education - 01/23/20 1044      Visit Information   Visit Type  First/Initial      Initial Visit   Diabetes Type  Type 2    Are you currently following a meal plan?  No    Are you taking your medications as prescribed?  Yes      Health Coping   How would you rate your overall health?  Fair      Psychosocial Assessment   Patient Belief/Attitude about Diabetes  Motivated to manage diabetes    Self-care barriers  None    Self-management support  Family      Pre-Education Assessment   Patient understands the diabetes disease and treatment process.  Needs Instruction    Patient understands incorporating nutritional management into lifestyle.  Needs Instruction    Patient undertands incorporating physical activity into lifestyle.  Needs Instruction    Patient understands using medications safely.  Needs Instruction    Patient understands monitoring blood glucose, interpreting and using results  Needs Instruction    Patient understands prevention, detection, and treatment of acute complications.  Needs Instruction    Patient understands prevention, detection, and treatment of chronic complications.  Needs Instruction    Patient understands how to develop strategies to address psychosocial issues.  Needs Instruction    Patient understands how to develop strategies to promote health/change behavior.  Needs Instruction      Complications   Last HgB A1C per patient/outside source  7.5 %    How often do you check your blood  sugar?  1-2 times/day    Fasting Blood glucose range (mg/dL)  70-129    Number of hypoglycemic episodes per month  0    Number of hyperglycemic episodes per week  0    Have you had a dilated eye exam in the past 12 months?  No    Have you had a dental exam in the past 12 months?  Yes    Are you checking your feet?  Yes    How many days per week are you checking your feet?  7      Dietary Intake   Breakfast  bacon and 2 eggs or cheerios or cheese toast or english muffin with peanut butter and jelly    Lunch  sandwich (PB and bana) or tuley and ham    Snack (afternoon)  cheese or crackers with peanut butter or nuts    Dinner  salmon or burger or pork chop or BBQ + fries or baked potato    Snack (evening)  popcorn    Beverage(s)  water, sweet tea, coffee + cream, beer, wine      Exercise   Exercise Type  Light (walking / raking leaves)    How many days per week to you exercise?  3    How many minutes per day do you exercise?  30    Total minutes per week of exercise  90      Patient Education  Previous Diabetes Education  No    Disease state   Factors that contribute to the development of diabetes    Acute complications  Taught treatment of hypoglycemia - the 15 rule.      Individualized Goals (developed by patient)   Nutrition  Follow meal plan discussed    Physical Activity  Exercise 5-7 days per week;30 minutes per day;45 minutes per day    Monitoring   test my blood glucose as discussed;test blood glucose pre and post meals as discussed      Post-Education Assessment   Patient understands the diabetes disease and treatment process.  Demonstrates understanding / competency    Patient understands incorporating nutritional management into lifestyle.  Demonstrates understanding / competency    Patient undertands incorporating physical activity into lifestyle.  Demonstrates understanding / competency    Patient understands using medications safely.  Demonstrates understanding /  competency    Patient understands monitoring blood glucose, interpreting and using results  Demonstrates understanding / competency    Patient understands prevention, detection, and treatment of acute complications.  Demonstrates understanding / competency    Patient understands prevention, detection, and treatment of chronic complications.  Demonstrates understanding / competency    Patient understands how to develop strategies to address psychosocial issues.  Demonstrates understanding / competency    Patient understands how to develop strategies to promote health/change behavior.  Demonstrates understanding / competency      Outcomes   Expected Outcomes  Demonstrated interest in learning. Expect positive outcomes    Future DMSE  PRN    Program Status  Completed       Individualized Plan for Diabetes Self-Management Training:   Learning Objective:  Patient will have a greater understanding of diabetes self-management. Patient education plan is to attend individual and/or group sessions per assessed needs and concerns.   Plan:   There are no Patient Instructions on file for this visit.  Expected Outcomes:  Demonstrated interest in learning. Expect positive outcomes  Education material provided: ADA - How to Thrive: A Guide for Your Journey with Diabetes and My Plate  If problems or questions, patient to contact team via:  Phone  Future DSME appointment: PRN

## 2020-02-13 DIAGNOSIS — H2513 Age-related nuclear cataract, bilateral: Secondary | ICD-10-CM | POA: Diagnosis not present

## 2020-02-13 DIAGNOSIS — H0102B Squamous blepharitis left eye, upper and lower eyelids: Secondary | ICD-10-CM | POA: Diagnosis not present

## 2020-02-13 DIAGNOSIS — G43109 Migraine with aura, not intractable, without status migrainosus: Secondary | ICD-10-CM | POA: Diagnosis not present

## 2020-02-13 DIAGNOSIS — H0102A Squamous blepharitis right eye, upper and lower eyelids: Secondary | ICD-10-CM | POA: Diagnosis not present

## 2020-02-13 DIAGNOSIS — E119 Type 2 diabetes mellitus without complications: Secondary | ICD-10-CM | POA: Diagnosis not present

## 2020-02-13 DIAGNOSIS — H43811 Vitreous degeneration, right eye: Secondary | ICD-10-CM | POA: Diagnosis not present

## 2020-03-23 DIAGNOSIS — I700 Atherosclerosis of aorta: Secondary | ICD-10-CM | POA: Diagnosis not present

## 2020-03-23 DIAGNOSIS — E782 Mixed hyperlipidemia: Secondary | ICD-10-CM | POA: Diagnosis not present

## 2020-03-23 DIAGNOSIS — E1169 Type 2 diabetes mellitus with other specified complication: Secondary | ICD-10-CM | POA: Diagnosis not present

## 2020-03-23 DIAGNOSIS — M859 Disorder of bone density and structure, unspecified: Secondary | ICD-10-CM | POA: Diagnosis not present

## 2020-04-11 ENCOUNTER — Encounter (HOSPITAL_COMMUNITY): Payer: Self-pay | Admitting: Emergency Medicine

## 2020-04-11 ENCOUNTER — Emergency Department (HOSPITAL_COMMUNITY): Payer: PPO

## 2020-04-11 ENCOUNTER — Emergency Department (HOSPITAL_COMMUNITY)
Admission: EM | Admit: 2020-04-11 | Discharge: 2020-04-11 | Disposition: A | Discharge status: Home / Self Care | Payer: PPO | Attending: Emergency Medicine | Admitting: Emergency Medicine

## 2020-04-11 DIAGNOSIS — R002 Palpitations: Secondary | ICD-10-CM

## 2020-04-11 DIAGNOSIS — Z7984 Long term (current) use of oral hypoglycemic drugs: Secondary | ICD-10-CM | POA: Insufficient documentation

## 2020-04-11 DIAGNOSIS — E119 Type 2 diabetes mellitus without complications: Secondary | ICD-10-CM | POA: Insufficient documentation

## 2020-04-11 DIAGNOSIS — I493 Ventricular premature depolarization: Secondary | ICD-10-CM | POA: Diagnosis not present

## 2020-04-11 LAB — BASIC METABOLIC PANEL
Anion gap: 13 (ref 5–15)
BUN: 15 mg/dL (ref 8–23)
CO2: 25 mmol/L (ref 22–32)
Calcium: 9.8 mg/dL (ref 8.9–10.3)
Chloride: 103 mmol/L (ref 98–111)
Creatinine, Ser: 0.72 mg/dL (ref 0.44–1.00)
GFR calc Af Amer: >60 mL/min (ref >60)
GFR calc non Af Amer: >60 mL/min (ref >60)
Glucose, Bld: 207 mg/dL — ABNORMAL HIGH (ref 70–99)
Potassium: 3.7 mmol/L (ref 3.5–5.1)
Sodium: 141 mmol/L (ref 135–145)

## 2020-04-11 LAB — CBC
HCT: 43.3 % (ref 36.0–46.0)
Hemoglobin: 14.4 g/dL (ref 12.0–15.0)
MCH: 29.3 pg (ref 26.0–34.0)
MCHC: 33.3 g/dL (ref 30.0–36.0)
MCV: 88.2 fL (ref 80.0–100.0)
Platelets: 203 10*3/uL (ref 150–400)
RBC: 4.91 MIL/uL (ref 3.87–5.11)
RDW: 12.7 % (ref 11.5–15.5)
WBC: 5.7 10*3/uL (ref 4.0–10.5)
nRBC: 0 % (ref 0.0–0.2)

## 2020-04-11 MED ORDER — SODIUM CHLORIDE 0.9% FLUSH: 3.0000 mL | Freq: Once | INTRAVENOUS | Status: DC

## 2020-04-11 NOTE — ED Notes (Signed)
Discharge instructions discussed with Pt and husband. Pt verbalized understanding. Pt stable and ambulatory.

## 2020-04-11 NOTE — ED Triage Notes (Signed)
Reports palpitations a few days ago that resolved, this morning around 0330 began having palpitations again and feeling like her heart is skipping a beat. Denies hx of afib, no chest pain or sob. Also endorses some numbness and tingling to L arm for the past few weeks. Denies any weakness or other neuro related symptoms. Pt a/ox4, resp e/u, nad.

## 2020-04-11 NOTE — Discharge Instructions (Signed)
You were found to have premature ventricular contractions (PVCs). We saw these on your EKG and on the cardiac monitor. The chest x-ray done did not show any obvious structural issues in your heart. Your lab work was unremarkable, did not show anemia or electrolyte abnormalities. We recommend you call the cardiologist to schedule an appointment for possible further work-up. We also recommend that you follow-up with your PCP to let them know you have been seen here in the emergency department. If you develop chest pain, start sweating profusely, or otherwise feel very poorly, please return to the emergency department for reevaluation.

## 2020-04-11 NOTE — ED Provider Notes (Signed)
[facility] EMERGENCY DEPARTMENT Provider Note   CSN: [id number] Arrival date & time: 04/11/20  0846     History Chief Complaint  Patient presents with  . Palpitations    Carol Bell is a 69 y.o. woman with history of GERD, DM2 on metformin, and HLD who presented to the ED after being woken up from sleep by heart palpitations.  The patient reports she first experienced heart palpitations while taking opioids following knee surgery in 05/2018. At that time, she was seen by a cardiologist who told her the palpitations were age-related. She states the palpitations largely abated after discontinuing the pain medication. However, in the last few weeks she has noticed intermittent, 1-2 minute episodes of palpitations, which seem to randomly occur. Starting three days ago, on Sunday, 7/11, she reports having more frequent, though still brief, episodes. What concerned her and instigated this visit to the ED is that she was woken up from sleep this morning at 3:30AM with palpitations which have been persistent and are ongoing on my evaluation.   She denies recent fever or chills, associated chest pain, shortness of breath, dizziness, nausea/vomiting, dysuria, diarrhea, weakness, fatigue, headache. Reports have a history of a left frozen shoulder and intermittent tingling in her left arm but denies other paresthesias.   The only recent change to her medications is that she just started Crestor 1-2 weeks ago after having GI symptoms with Lipitor. Otherwise, she is tolerating her metformin, Wellbutrin, and omeprazole well. She takes a garlic supplement which she has taken for a long time.  She does not drink caffeine on a regular basis, drinks alcohol only occasionally, and does not smoke. She does report a recent increase in stress over the last week which she attributes to family visiting recently. Also reports having lost 7 lb over the last three months through portion control and  frequent exercise, but denies taking any weight loss or dietary supplements.      Past Medical History:  Diagnosis Date  . Depression   . Diabetes mellitus without complication ([place])   . Family history of BRCA2 gene positive   . Family history of breast cancer   . Family history of ovarian cancer   . Family history of pancreatic cancer   . Family history of prostate cancer   . History of kidney stones   . PONV (postoperative nausea and vomiting)     Patient Active Problem List   Diagnosis Date Noted  . Primary localized osteoarthrosis of the knee, right 06/14/2018  . Genetic testing 09/09/2017  . Family history of breast cancer   . Family history of prostate cancer   . Family history of pancreatic cancer   . Family history of ovarian cancer   . Family history of BRCA2 gene positive     Past Surgical History:  Procedure Laterality Date  . ABDOMINAL HYSTERECTOMY    . BREAST REDUCTION SURGERY  2000  . CARPAL TUNNEL RELEASE  2012  . KNEE ARTHROSCOPY  10-04-02&07-05-04   right  . REDUCTION MAMMAPLASTY    . 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     Family History  Problem Relation Age of Onset  . Dementia Mother   . Breast cancer Father 81  . Prostate cancer Father 36  . Melanoma Father  28  . Breast cancer Sister 39  . Pancreatic cancer Sister   . Diabetes Brother   . Breast cancer Paternal Beryle Lathe of age of onset  . Brain cancer Paternal Uncle   . Kidney disease Maternal Grandmother   . Melanoma Maternal Grandfather   . Diabetes Paternal Grandmother   . Diabetes Paternal Grandfather   . Stomach cancer Paternal Aunt   . Melanoma Paternal Aunt   . Breast cancer Cousin        3 paternal first cousins, ? onset  . Bladder Cancer Cousin        paternal first cousin  .  Prostate cancer Cousin        paternal first cousin  . Prostate cancer Other 34       metastatic prostate cancer  . Breast cancer Other        great niece, ? onset  . Ovarian cancer Other   . Colon cancer Neg Hx     Social History   Tobacco Use  . Smoking status: Never Smoker  . Smokeless tobacco: Never Used  Vaping Use  . Vaping Use: Never used  Substance Use Topics  . Alcohol use: No  . Drug use: No    Home Medications Prior to Admission medications   Medication Sig Start Date End Date Taking? Authorizing Provider  acetaminophen (TYLENOL) 500 MG tablet Take 2 tablets (1,000 mg total) by mouth every 6 (six) hours as needed for moderate pain. 06/15/18  Yes Shepperson, Kirstin, PA-C  buPROPion (WELLBUTRIN XL) 300 MG 24 hr tablet Take 300 mg by mouth daily.   Yes [provider]  Calcium Carbonate-Vitamin D (CALCIUM 600+D PO) Take 1 tablet by mouth 2 (two) times daily.   Yes [provider]  calcium gluconate 500 MG tablet Take 1 tablet by mouth 3 (three) times daily.   Yes [provider]  Carboxymethylcellul-Glycerin (LUBRICATING EYE DROPS OP) Place 1 drop into both eyes daily as needed (dry eyes).   Yes [provider]  glucosamine-chondroitin 500-400 MG tablet Take 1 tablet by mouth 3 (three) times daily.   Yes [provider]  MELATONIN-THEANINE PO Take 1 tablet by mouth at bedtime as needed (sleep).   Yes [provider]  metFORMIN (GLUCOPHAGE-XR) 500 MG 24 hr tablet Take 500 mg by mouth 2 (two) times daily. 02/29/20  Yes [provider]  Multiple Vitamin (MULTIVITAMIN WITH MINERALS) TABS tablet Take 1 tablet by mouth daily.   Yes [provider]  Omega-3 Fatty Acids (FISH OIL) 1000 MG CAPS Take 1 capsule by mouth daily.   Yes [provider]  omeprazole (PRILOSEC) 20 MG capsule Take 20 mg by mouth daily.   Yes [provider]  rosuvastatin (CRESTOR) 5 MG tablet Take 5 mg by mouth daily.  03/23/20  Yes [provider]  Turmeric (QC TUMERIC COMPLEX) 500 MG CAPS Take 1 capsule by mouth daily.   Yes [provider]  amoxicillin (AMOXIL) 875 MG tablet Take 875 mg by mouth 2 (two) times daily. 04/09/20   [provider]  aspirin EC 325 MG EC tablet 1 tab a day for the next 30 days to prevent blood clots Patient not taking: Reported on 04/11/2020 06/15/18   Shepperson, Kirstin, PA-C  docusate sodium (COLACE) 100 MG capsule 1 tab 2 times a day while on narcotics.  STOOL SOFTENER Patient not taking: Reported on 04/11/2020 06/15/18   Shepperson, Kirstin, PA-C  gabapentin (  NEURONTIN) 600 MG tablet Take 0.5 tablets (300 mg total) by mouth at bedtime. For nerve pain Patient not taking: Reported on 04/11/2020 06/15/18   Shepperson, Kirstin, PA-C  ibuprofen (ADVIL,MOTRIN) 200 MG tablet Take 2 tablets (400 mg total) by mouth every 4 (four) hours as needed for headache or moderate pain (can resume at 10 days after surgery). Patient not taking: Reported on 04/11/2020 06/15/18   Shepperson, Kirstin, PA-C  oxyCODONE (OXY IR/ROXICODONE) 5 MG immediate release tablet 1 po q 4 hrs prn pain.  Patient had a total knee replacement on 06/14/2018 Patient not taking: Reported on 04/11/2020 06/15/18   Shepperson, Kirstin, PA-C  polyethylene glycol (MIRALAX / GLYCOLAX) packet 17grams in 6 oz of water twice a day until bowel movement.  LAXITIVE.  Restart if two days since last bowel movement Patient not taking: Reported on 04/11/2020 06/15/18   Shepperson, Kirstin, PA-C    Allergies    Codeine and Other  Review of Systems   Review of Systems  Constitutional: Negative for activity change, appetite change, chills, fatigue and fever.  HENT: Negative for congestion.   Respiratory: Negative for cough, chest tightness and shortness of breath.   Cardiovascular: Positive for palpitations. Negative for chest pain.  Gastrointestinal: Negative for abdominal pain, diarrhea, nausea and vomiting.    Genitourinary: Negative for dysuria.  Skin: Negative for rash.  Neurological: Negative for dizziness, syncope, weakness, light-headedness, numbness and headaches.    Physical Exam Updated Vital Signs BP 133/63 (BP Location: Right Arm)   Pulse 78   Temp 97.8 F (36.6 C)   Resp 16   SpO2 100%   Physical Exam Constitutional:      General: She is not in acute distress.    Appearance: She is not ill-appearing.  HENT:     Head: Normocephalic and atraumatic.     Mouth/Throat:     Mouth: Mucous membranes are moist.     Pharynx: Oropharynx is clear.  Eyes:     Extraocular Movements: Extraocular movements intact.     Conjunctiva/sclera: Conjunctivae normal.     Pupils: Pupils are equal, round, and reactive to light.  Cardiovascular:     Rate and Rhythm: Rhythm irregular. Frequent extrasystoles are present.    Pulses: Normal pulses.     Heart sounds: No murmur heard.   Pulmonary:     Effort: Pulmonary effort is normal.     Breath sounds: Normal breath sounds.  Abdominal:     General: There is no distension.     Palpations: Abdomen is soft.     Tenderness: There is no abdominal tenderness.  Musculoskeletal:     Right lower leg: No edema.     Left lower leg: No edema.  Skin:    General: Skin is warm and dry.  Neurological:     Mental Status: She is alert and oriented to person, place, and time.     ED Results / Procedures / Treatments   Labs (all labs ordered are listed, but only abnormal results are displayed) Labs Reviewed  BASIC METABOLIC PANEL - Abnormal; Notable for the following components:      Result Value   Glucose, Bld 207 (*)    All other components within normal limits  CBC    EKG EKG Interpretation  Date/Time:  Wednesday April 11 2020 09:02:45 EDT Ventricular Rate:  80 PR Interval:  216 QRS Duration: 106 QT Interval:  386 QTC Calculation: 445 R Axis:   -60 Text Interpretation: Sinus rhythm with 1st degree A-V  block with occasional Premature  ventricular complexes Incomplete right bundle branch block Left anterior fascicular block Possible Anterior infarct , age undetermined Abnormal ECG No significant change since last tracing Confirmed by Deno Etienne [phone]) on 04/11/2020 3:10:05 PM   Radiology DG Chest 2 View  Result Date: 04/11/2020 CLINICAL DATA:  Palpitations EXAM: CHEST - 2 VIEW COMPARISON:  08/11/2012 FINDINGS: Normal heart size and mediastinal contours. No acute infiltrate or edema. No effusion or pneumothorax. Spondylosis. No acute osseous findings. IMPRESSION: No evidence of active disease. Electronically Signed   By: Monte Fantasia M.D.   On: 04/11/2020 09:24    Medications Ordered in ED Medications  sodium chloride flush (NS) 0.9 % injection 3 mL (has no administration in time range)    ED Course  I have reviewed the triage vital signs and the nursing notes.  Pertinent labs & imaging results that were available during my care of the patient were reviewed by me and considered in my medical decision making (see chart for details).    MDM Rules/Calculators/A&P                          LONDEN LORGE is a 69 y.o. woman with history of GERD, DM2 on metformin, and HLD who presented to the ED after being woken up from sleep by heart palpitations, found to have PVCs on EKG and telemetry.  Patient is well-appearing with stable vitals. EKG significant for premature ventricular contractions, no evidence for ischemia or infarct. BMP notable for hyperglycemia only. CBC unremarkable. CXR without evidence of structural heart damage. Recently switched from Lipitor to Crestor, otherwise no significant changes in medical history. Has been under increased stress from recent family visit. Gave patient a recommendation for a cardiologist and recommended follow-up with their PCP. Return precautions discussed, and patient discharged home.  Final Clinical Impression(s) / ED Diagnoses Final diagnoses:  PVC (premature ventricular  contraction)  Heart palpitations    Rx / DC Orders ED Discharge Orders    None       Alexandria Lodge, MD 04/11/20 Delle Reining, DO 04/11/20 2234

## 2020-04-16 DIAGNOSIS — J329 Chronic sinusitis, unspecified: Secondary | ICD-10-CM | POA: Diagnosis not present

## 2020-04-16 DIAGNOSIS — J3489 Other specified disorders of nose and nasal sinuses: Secondary | ICD-10-CM | POA: Diagnosis not present

## 2020-04-16 DIAGNOSIS — Z1152 Encounter for screening for COVID-19: Secondary | ICD-10-CM | POA: Diagnosis not present

## 2020-04-16 DIAGNOSIS — R002 Palpitations: Secondary | ICD-10-CM | POA: Diagnosis not present

## 2020-05-12 DIAGNOSIS — I493 Ventricular premature depolarization: Secondary | ICD-10-CM | POA: Insufficient documentation

## 2020-05-12 DIAGNOSIS — Z7189 Other specified counseling: Secondary | ICD-10-CM | POA: Insufficient documentation

## 2020-05-12 DIAGNOSIS — R002 Palpitations: Secondary | ICD-10-CM | POA: Insufficient documentation

## 2020-05-12 NOTE — Progress Notes (Signed)
Cardiology Office Note   Date:  05/14/2020   ID:  Carol Bell, DOB [birth year], MRN [id number]  PCP:  Michael Boston, MD  Cardiologist:   No primary care provider on file.   No chief complaint on file.     History of Present Illness: Carol Bell is a 69 y.o. female who is referred by Michael Boston, MD for evaluation of palpitations and PVCs.  She was in the emergency room in July for this.  She has no past cardiac history other than a POET (Plain Old Exercise Treadmill) in 2013.  In July she started having some palpitations.  This woke her at 3:30 in the morning.  It lasted all day until she presented to the emergency room.  I did review these records for this visit.  She was noted to have PVCs.  Electrolytes are unremarkable.  She did not require further therapy and was sent home and slowly over weeks has had improvement in her palpitations.  They are not happening as frequently although they did happen today while I was examining her.  She thinks some of it might be related to stress and she has a grandson who is having some emotional problems and being tested for ADHD/autism.  She is otherwise not had any cardiac symptoms and she walks for exercise.  She does a lot of housecleaning.  She denies chest pressure, neck or arm discomfort.  She had no new shortness of breath, PND or orthopnea.  She has had no weight gain or edema.   Past Medical History:  Diagnosis Date  . Depression   . Diabetes mellitus without complication ([place])   . Family history of BRCA2 gene positive   . Family history of breast cancer   . Family history of ovarian cancer   . Family history of pancreatic cancer   . History of kidney stones     Past Surgical History:  Procedure Laterality Date  . ABDOMINAL HYSTERECTOMY    . BREAST REDUCTION SURGERY  2000  . CARPAL TUNNEL RELEASE  2012  . KNEE ARTHROSCOPY  10-04-02&07-05-04   right  . TOTAL KNEE ARTHROPLASTY Right 06/14/2018   Procedure: TOTAL RIGHT KNEE  ARTHROPLASTY;  Surgeon: Elsie Saas, MD;  Location: [facility];  Service: Orthopedics;  Laterality: Right;     Current Outpatient Medications  Medication Sig Dispense Refill  . acetaminophen (TYLENOL) 500 MG tablet Take 2 tablets (1,000 mg total) by mouth every 6 (six) hours as needed for moderate pain. 30 tablet 0  . buPROPion (WELLBUTRIN XL) 300 MG 24 hr tablet Take 300 mg by mouth daily.    . Calcium Carbonate-Vitamin D (CALCIUM 600+D PO) Take 1 tablet by mouth 2 (two) times daily.    . calcium gluconate 500 MG tablet Take 1 tablet by mouth 3 (three) times daily.    Marland Kitchen glucosamine-chondroitin 500-400 MG tablet Take 1 tablet by mouth 3 (three) times daily.    Marland Kitchen ibuprofen (ADVIL,MOTRIN) 200 MG tablet Take 2 tablets (400 mg total) by mouth every 4 (four) hours as needed for headache or moderate pain (can resume at 10 days after surgery). 30 tablet 0  . METFORMIN HCL ER PO Take by mouth in the morning and at bedtime.    . Multiple Vitamin (MULTIVITAMIN WITH MINERALS) TABS tablet Take 1 tablet by mouth daily.    . Omega-3 Fatty Acids (FISH OIL) 1000 MG CAPS Take 1 capsule by mouth daily.    . rosuvastatin (CRESTOR) 5  MG tablet Take 5 mg by mouth daily.    . Turmeric (QC TUMERIC COMPLEX) 500 MG CAPS Take 1 capsule by mouth daily.    Marland Kitchen atenolol (TENORMIN) 25 MG tablet Take 0.5 tablets (12.5 mg total) by mouth 2 (two) times daily as needed (palpitations). 30 tablet 3   No current facility-administered medications for this visit.    Allergies:   Codeine and Other    Social History:  The patient  reports that she has never smoked. She has never used smokeless tobacco. She reports that she does not drink alcohol and does not use drugs.   Family History:  The patient's family history includes Bladder Cancer in her cousin; Brain cancer in her paternal uncle; Breast cancer in her cousin, paternal aunt, and another family member; Breast cancer (age of onset: 81) in her father and sister; Dementia in her  mother; Diabetes in her brother, paternal grandfather, and paternal grandmother; Kidney disease in her maternal grandmother; Melanoma in her maternal grandfather and paternal aunt; Melanoma (age of onset: 43) in her father; Ovarian cancer in an other family member; Pancreatic cancer in her sister; Prostate cancer in her cousin; Prostate cancer (age of onset: 21) in an other family member; Prostate cancer (age of onset: 58) in her father; Stomach cancer in her paternal aunt.    ROS:  Please see the history of present illness.   Otherwise, review of systems are positive for none.   All other systems are reviewed and negative.    PHYSICAL EXAM: VS:  BP 90/62 (BP Location: Left Arm, Patient Position: Sitting, Cuff Size: Normal)   Pulse 73   Ht '5\' 9"'  (1.753 m)   Wt 157 lb 9.6 oz (71.5 kg)   BMI 23.27 kg/m  , BMI Body mass index is 23.27 kg/m. GENERAL:  Well appearing HEENT:  Pupils equal round and reactive, fundi not visualized, oral mucosa unremarkable NECK:  No jugular venous distention, waveform within normal limits, carotid upstroke brisk and symmetric, no bruits, no thyromegaly LYMPHATICS:  No cervical, inguinal adenopathy LUNGS:  Clear to auscultation bilaterally BACK:  No CVA tenderness CHEST:  Unremarkable HEART:  PMI not displaced or sustained,S1 and S2 within normal limits, no S3, no S4, no clicks, no rubs, no murmurs ABD:  Flat, positive bowel sounds normal in frequency in pitch, no bruits, no rebound, no guarding, no midline pulsatile mass, no hepatomegaly, no splenomegaly EXT:  2 plus pulses throughout, no edema, no cyanosis no clubbing SKIN:  No rashes no nodules NEURO:  Cranial nerves II through XII grossly intact, motor grossly intact throughout PSYCH:  Cognitively intact, oriented to person place and time    EKG:  EKG is ordered today. The ekg ordered today demonstrates sinus rhythm, rate 73, leftward axis, RSR prime V1 and V2, no acute ST-T wave changes.   Recent  Labs: 04/11/2020: BUN 15; Creatinine, Ser 0.72; Hemoglobin 14.4; Platelets 203; Potassium 3.7; Sodium 141    Lipid Panel No results found for: CHOL, TRIG, HDL, CHOLHDL, VLDL, LDLCALC, LDLDIRECT    Wt Readings from Last 3 Encounters:  05/14/20 157 lb 9.6 oz (71.5 kg)  01/23/20 161 lb (73 kg)  06/14/18 163 lb 6.4 oz (74.1 kg)      Other studies Reviewed: Additional studies/ records that were reviewed today include: ED records. Review of the above records demonstrates:  Please see elsewhere in the note.     ASSESSMENT AND PLAN:  PALPITATIONS:    She has PVCs.  I am to  have her wear a 3-day monitor.  I will check an echocardiogram to make sure she has a structurally normal heart.  I will give her atenolol 12.5 mg as needed as a pill in pocket.  PVCs: This will be evaluated as above.  COVID EDUCATION: She has had her vaccine.  Current medicines are reviewed at length with the patient today.  The patient does not have concerns regarding medicines.  The following changes have been made:  no change  Labs/ tests ordered today include:   Orders Placed This Encounter  Procedures  . LONG TERM MONITOR (3-14 DAYS)  . EKG 12-Lead  . ECHOCARDIOGRAM COMPLETE     Disposition:   FU with me in four months.     Signed, Minus Breeding, MD  05/14/2020 12:17 PM    [facility]

## 2020-05-14 ENCOUNTER — Encounter: Payer: Self-pay | Admitting: *Deleted

## 2020-05-14 ENCOUNTER — Other Ambulatory Visit: Payer: Self-pay

## 2020-05-14 ENCOUNTER — Encounter: Payer: Self-pay | Admitting: Cardiology

## 2020-05-14 ENCOUNTER — Telehealth: Payer: Self-pay | Admitting: *Deleted

## 2020-05-14 ENCOUNTER — Ambulatory Visit: Payer: PPO | Admitting: Cardiology

## 2020-05-14 VITALS — BP 90/62 | HR 73 | Ht 69.0 in | Wt 157.6 lb

## 2020-05-14 DIAGNOSIS — Z7189 Other specified counseling: Secondary | ICD-10-CM | POA: Diagnosis not present

## 2020-05-14 DIAGNOSIS — R002 Palpitations: Secondary | ICD-10-CM

## 2020-05-14 DIAGNOSIS — I493 Ventricular premature depolarization: Secondary | ICD-10-CM

## 2020-05-14 MED ORDER — ATENOLOL 25 MG PO TABS: 12.5000 mg | ORAL_TABLET | Freq: Two times a day (BID) | ORAL | 3 refills | Status: DC | PRN

## 2020-05-14 NOTE — Patient Instructions (Signed)
Medication Instructions:  Start atenolol 25mg . Take 1/2 tablet twice a day as needed for palpitations. *If you need a refill on your cardiac medications before your next appointment, please call your pharmacy*  Lab Work: None ordered this visit  Testing/Procedures: Your physician has requested that you have an echocardiogram. Echocardiography is a painless test that uses sound waves to create images of your heart. It provides your doctor with information about the size and shape of your heart and how well your heart's chambers and valves are working. This procedure takes approximately one hour. There are no restrictions for this procedure.  Cardiac Monitor for 3 days  Follow-Up: At [facility], you and your health needs are our priority.  As part of our continuing mission to provide you with exceptional heart care, we have created designated Provider Care Teams.  These Care Teams include your primary Cardiologist (physician) and Advanced Practice Providers (APPs -  Physician Assistants and Nurse Practitioners) who all work together to provide you with the care you need, when you need it.  Your next appointment:   4 month(s)  The format for your next appointment:   In Person  Provider:   [facility], MD  Other Instructions ZIO XT- Long Term Monitor Instructions   Your physician has requested you wear your ZIO patch monitor__4__days.   This is a single patch monitor.  [employer] supplies one patch monitor per enrollment.  Additional stickers are not available.   Please do not apply patch if you will be having a Nuclear Stress Test, Echocardiogram, Cardiac CT, MRI, or Chest Xray during the time frame you would be wearing the monitor. The patch cannot be worn during these tests.  You cannot remove and re-apply the ZIO XT patch monitor.   Your ZIO patch monitor will be sent USPS Priority mail from [facility] directly to your home address. The monitor may also be mailed to a  PO BOX if home delivery is not available.   It may take 3-5 days to receive your monitor after you have been enrolled.   Once you have received you monitor, please review enclosed instructions.  Your monitor has already been registered assigning a specific monitor serial # to you.   Applying the monitor   Shave hair from upper left chest.   Hold abrader disc by orange tab.  Rub abrader in 40 strokes over left upper chest as indicated in your monitor instructions.   Clean area with 4 enclosed alcohol pads .  Use all pads to assure are is cleaned thoroughly.  Let dry.   Apply patch as indicated in monitor instructions.  Patch will be place under collarbone on left side of chest with arrow pointing upward.   Rub patch adhesive wings for 2 minutes.Remove white label marked "1".  Remove white label marked "2".  Rub patch adhesive wings for 2 additional minutes.   While looking in a mirror, press and release button in center of patch.  A small green light will flash 3-4 times .  This will be your only indicator the monitor has been turned on.     Do not shower for the first 24 hours.  You may shower after the first 24 hours.   Press button if you feel a symptom. You will hear a small click.  Record Date, Time and Symptom in the Patient Log Book.   When you are ready to remove patch, follow instructions on last 2 pages of Patient Log Book.  Stick  patch monitor onto last page of Patient Log Book.   Place Patient Log Book in [employer] box.  Use locking tab on box and tape box closed securely.  The [employer] has [employer] on it.  Please place in mailbox as soon as possible.  Your physician should have your test results approximately 7 days after the monitor has been mailed back to [facility].   Call [facility] Customer Care at [phone] if you have questions regarding your ZIO XT patch monitor.  Call them immediately if you see an orange light blinking on your monitor.   If  your monitor falls off in less than 4 days contact our Monitor department at [phone].  If your monitor becomes loose or falls off after 4 days call [employer] at [phone] for suggestions on securing your monitor.

## 2020-05-14 NOTE — Progress Notes (Signed)
Patient ID: Carol Bell, female   DOB: Mar 23, 1951, 69 y.o.   MRN: [id number]  Patient enrolled for [employer] to ship a 3 day ZIO XT long term holter monitor to her home.

## 2020-05-14 NOTE — Telephone Encounter (Signed)
[employer] has already shipped monitor to [address], [place], [place]. You should receive the monitor 8/17 or 8/18. If you need a monitor shipped to a different address, please call [phone] and leave address information.  We will have a second monitor shipped. If so, please mail unused monitor back to [employer] box located inside shipping box.  Please tape package closed before mailing.  There is no charge for an monitor not applied and turned on.

## 2020-05-17 ENCOUNTER — Ambulatory Visit (INDEPENDENT_AMBULATORY_CARE_PROVIDER_SITE_OTHER): Payer: PPO

## 2020-05-17 DIAGNOSIS — R002 Palpitations: Secondary | ICD-10-CM

## 2020-05-17 DIAGNOSIS — I493 Ventricular premature depolarization: Secondary | ICD-10-CM | POA: Diagnosis not present

## 2020-05-23 ENCOUNTER — Ambulatory Visit: Payer: PPO | Admitting: Cardiology

## 2020-05-23 ENCOUNTER — Ambulatory Visit: Payer: PPO | Admitting: Cardiovascular Disease

## 2020-05-28 ENCOUNTER — Other Ambulatory Visit: Payer: Self-pay

## 2020-05-28 ENCOUNTER — Ambulatory Visit (HOSPITAL_COMMUNITY): Payer: PPO | Attending: Cardiology

## 2020-05-28 DIAGNOSIS — R002 Palpitations: Secondary | ICD-10-CM | POA: Diagnosis not present

## 2020-05-28 DIAGNOSIS — I340 Nonrheumatic mitral (valve) insufficiency: Secondary | ICD-10-CM | POA: Insufficient documentation

## 2020-05-28 DIAGNOSIS — I493 Ventricular premature depolarization: Secondary | ICD-10-CM | POA: Diagnosis not present

## 2020-05-28 LAB — ECHOCARDIOGRAM COMPLETE
Area-P 1/2: 3.31 cm2
S' Lateral: 2.2 cm

## 2020-05-30 DIAGNOSIS — R002 Palpitations: Secondary | ICD-10-CM | POA: Diagnosis not present

## 2020-05-30 DIAGNOSIS — I493 Ventricular premature depolarization: Secondary | ICD-10-CM | POA: Diagnosis not present

## 2020-06-06 ENCOUNTER — Telehealth: Payer: Self-pay | Admitting: Cardiology

## 2020-06-06 NOTE — Telephone Encounter (Signed)
Spoke with pt and has taken Atenolol as directed almost every single day for palpitations after turning monitor in. Per pt notes leg weakness,h/a,lightheadedness,and feeling like going to pass out B/P today was 116/68 and HR 61 .Pt hardly drinks any caffeine 1 cup of coffee in am and mid day glass of tea Pt thinks the Atenolol is the culprit Will forward to Dr Antoine Poche for review and recommendations ./cy

## 2020-06-06 NOTE — Telephone Encounter (Signed)
° ° ° °  Pt c/o medication issue:  1. Name of Medication:   atenolol (TENORMIN) 25 MG tablet    2. How are you currently taking this medication (dosage and times per day)? Take 0.5 tablets (12.5 mg total) by mouth 2 (two) times daily as needed (palpitations).  3. Are you having a reaction (difficulty breathing--STAT)?   4. What is your medication issue? Pt said since she started taking this medication, she started to feel fatigue, lightheadedness and sometimes feel like she's about to pass out. She said she started feeling this last week and today it feel worst.

## 2020-06-07 ENCOUNTER — Telehealth: Payer: Self-pay

## 2020-06-07 NOTE — Telephone Encounter (Signed)
Patient made aware of the following results regarding Longterm Monitor  Rollene Rotunda, MD  06/04/2020 10:48 AM EDT  She had rare PACs and PVCs. No significant arrhythmias. No change in therapy. Plans as outlined in the office note. Call Ms. Zappia with the results and send results to Melida Quitter, MD  Patient stated yesterday she felt like she had a lot of palpitations and took her PRN atenolol, and she felt very tired and weak. Today she reports feeling a whole lot better and is not having any palpitations and has not taken her atenolol. Patient reports her BP is 141/68 with HR of 67. Advised patient I would forward message to Dr. Antoine Poche.   Results forwarded to PCP.

## 2020-06-07 NOTE — Telephone Encounter (Signed)
We could stop the atenolol PRN and try a different beta blocker like metoprolol 25 mg take 1/2 q8 hours PRN.  This is shorter acting and lower dose.

## 2020-06-15 ENCOUNTER — Ambulatory Visit: Payer: PPO | Admitting: Cardiovascular Disease

## 2020-06-15 ENCOUNTER — Telehealth: Payer: Self-pay | Admitting: *Deleted

## 2020-06-15 MED ORDER — PROPRANOLOL HCL 10 MG PO TABS: 10.0000 mg | ORAL_TABLET | Freq: Three times a day (TID) | ORAL | 1 refills | Status: DC | PRN

## 2020-06-15 NOTE — Telephone Encounter (Signed)
Carol Rotunda, MD  You 6 hours ago (9:58 AM)   Please call the patient. I am traveling and will not be able to call back today. I found no high risk findings on the monitor. She does have PVCs and PACS that she feels. We can stop the Atenolol and try propranolol 10 mg po tid prn. This might cause less fatigue. I will be able to call on Monday. If she needs to speak to somebody sooner please route to the DOD today. Please mover her appt up with me. I am not in the office next week but could see her the week after. I will not be connected to the internet the rest of the day. Thanks.   Message text   You routed conversation to Carol Rotunda, MD 8 hours ago (8:02 AM)  Loleta Chance Willette Pa, MD 18 hours ago (9:33 PM)   Hi Dr. Antoine Poche, I called last Wednesday  (9/8) explaining that the Atenolol was making me feel weak, exhausted, and giving me headaches and my palpatations were still constant. The nurse stated she was going to send you a message. I talked to 2 other nurses (one on Thurs re: my monitor results and one on Friday re: my echo), both said they were going to send a message to you and one said she would have you call me. In addition to resolving the issues with the medication, I am confused about the heart monitor saying the PVCs were rare, as I am having them every day just about all day and I am staying tired.  Please call me at [phone] as soon as you can. Carol Bell with patient regarding the information above.  Pt is c/o staying very tired and having no energy for 2 weeks now, with it being worse over the last week.  She reports her HR has been between 50-55 bpm and her BP 120/60-1300/72.  She reports not taking any Atenolol since Wednesday.  She states she is having palpitations continuously and doesn't understand why the monitor does not demonstrate this.  Pt would like to try taking Propranolol 10 mg PO TID PRN instead of the Atenolol.  I did advised  beta-blockers do very frequently cause people to feel fatigued until their body adjusts to the lower HR and BP.  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 Pt does carry DX of DM - blood sugars have been 130-140 which has been her normal.  Last CBC was 2 months ago and was normal.  Pt has not noticed any dark-tarry stools and has not seen any indication of bleeding.  She is going to call her PCP for further evaluation as well.  She would like the RX for propranolol sent into [facility].  Will forward this information to Dr Antoine Poche for his knowledge.

## 2020-06-15 NOTE — Telephone Encounter (Signed)
I sent her a message.  Please see if there are any APPs who can see her next week.   I am not in the office.  Thanks.

## 2020-06-18 NOTE — Telephone Encounter (Signed)
Left message for pt to c/b to schedule appt with APP as rescheduled by Dr Antoine Poche.

## 2020-06-19 NOTE — Telephone Encounter (Signed)
Pt has been scheduled for 9/22

## 2020-06-20 ENCOUNTER — Encounter: Payer: Self-pay | Admitting: Physician Assistant

## 2020-06-20 ENCOUNTER — Other Ambulatory Visit: Payer: Self-pay

## 2020-06-20 ENCOUNTER — Ambulatory Visit: Payer: PPO | Admitting: Physician Assistant

## 2020-06-20 VITALS — BP 114/60 | HR 74 | Ht 69.0 in | Wt 159.0 lb

## 2020-06-20 DIAGNOSIS — E119 Type 2 diabetes mellitus without complications: Secondary | ICD-10-CM | POA: Diagnosis not present

## 2020-06-20 DIAGNOSIS — R002 Palpitations: Secondary | ICD-10-CM

## 2020-06-20 NOTE — Patient Instructions (Addendum)
Medication Instructions:   PICK UP your Propanolol from your local pharmacy. Take 1 tablet 3 times a day as needed for palpitations  *If you need a refill on your cardiac medications before your next appointment, please call your pharmacy*  Lab Work: NONE ordered at this time of appointment   If you have labs (blood work) drawn today and your tests are completely normal, you will receive your results only by: Marland Kitchen MyChart Message (if you have MyChart) OR . A paper copy in the mail If you have any lab test that is abnormal or we need to change your treatment, we will call you to review the results.  Testing/Procedures: NONE ordered at this time of appointment   Follow-Up: At [facility], you and your health needs are our priority.  As part of our continuing mission to provide you with exceptional heart care, we have created designated Provider Care Teams.  These Care Teams include your primary Cardiologist (physician) and Advanced Practice Providers (APPs -  Physician Assistants and Nurse Practitioners) who all work together to provide you with the care you need, when you need it.  Your next appointment:   1 month(s)  The format for your next appointment:   In Person  Provider:   Rollene Rotunda, MD or Azalee Course, PA-C   Other Instructions   Please a dose of your Propanolol 2-3 hours prior to your next Cardiology office visit

## 2020-06-20 NOTE — Progress Notes (Signed)
Cardiology Office Note:    Date:  06/22/2020   ID:  ESTY AHUJA, DOB 1951-03-31, MRN [id number]  PCP:  Michael Boston, MD  [facility] Cardiologist:  Minus Breeding, MD  [place] Electrophysiologist:  None   Referring MD: Michael Boston, MD   Chief Complaint  Patient presents with  . Follow-up    seen for Dr. Percival Spanish    History of Present Illness:    Carol Bell is a 69 y.o. female with a hx of DM 2 and palpitations.  She had a normal POET in 2013.  More recently, patient was referred to cardiology service for evaluation of palpitation and PVCs that woke her up at night on 04/11/2020.  She was evaluated in the ED and noted to have PVCs.  Electrolyte was normal.  She was last seen by Dr. Percival Spanish on 05/14/2020 at which time her symptom has largely improved.  At the time, her systolic blood pressure was in the 90s.  She was given atenolol 25 mg tablet and was told to take half a tablet twice a day as needed for palpitation.  She was also given a 3-day ZIO monitor as well and arranged to have outpatient echocardiogram.  Echocardiogram obtained on 05/28/2020 showed EF 60 to 65%, RVSP 28.6 mmHg, moderate asymmetric LVH of basal septal segment, trivial AI.  Her monitor showed rare PACs and PVCs, no significant arrhythmia.  Atenolol was later switched to propranolol due to side effect of lightheadedness and the leg weakness.  Patient presents today accompanied by her husband.  She has not picked up the propranolol from her pharmacy yet.  She continued to have frequent palpitation.  On EKG, she is in trigeminy at this point.  She says she feels like her heart is skipping a beat once every few seconds.  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  I recommended she start on the propranolol to help suppress the PVCs.  I prefer to avoid antiarrhythmic therapy for the suppression of PVC at this point, especially since it is likely a off label use.  If her symptoms cannot be controlled on propranolol, I  plan to refer the patient to EP service.  Past Medical History:  Diagnosis Date  . Depression   . Diabetes mellitus without complication ([place])   . Family history of BRCA2 gene positive   . Family history of breast cancer   . Family history of ovarian cancer   . Family history of pancreatic cancer   . History of kidney stones     Past Surgical History:  Procedure Laterality Date  . ABDOMINAL HYSTERECTOMY    . BREAST REDUCTION SURGERY  2000  . CARPAL TUNNEL RELEASE  2012  . KNEE ARTHROSCOPY  10-04-02&07-05-04   right  . 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 atenolol (TENORMIN) 25 MG tablet Take 12.5 mg by mouth as needed.  Marland Kitchen buPROPion (WELLBUTRIN XL) 300 MG 24 hr tablet Take 300 mg by mouth daily.  . Calcium Carbonate-Vitamin D (CALCIUM 600+D PO) Take 1 tablet by mouth 2 (two) times daily.  . calcium gluconate 500 MG tablet Take 1 tablet by mouth 3 (three) times daily.  Marland Kitchen glucosamine-chondroitin 500-400 MG tablet Take 1 tablet by mouth 3 (three) times daily.  . metFORMIN (GLUCOPHAGE-XR) 500 MG 24 hr tablet  Take 500 mg by mouth 2 (two) times daily.  . Multiple Vitamin (MULTIVITAMIN WITH MINERALS) TABS tablet Take 1 tablet by mouth daily.  . Omega-3 Fatty Acids (FISH OIL) 1000 MG CAPS Take 1 capsule by mouth daily.  . propranolol (INDERAL) 10 MG tablet Take 1 tablet (10 mg total) by mouth 3 (three) times daily as needed (as needed for palpitation).  . Turmeric (QC TUMERIC COMPLEX) 500 MG CAPS Take 1 capsule by mouth daily.     Allergies:   Codeine and Other   Social History   Socioeconomic History  . Marital status: Married    Spouse name: Not on file  . Number of children: Not on file  . Years of education: Not on file  . Highest education level: Not on file  Occupational History    . Not on file  Tobacco Use  . Smoking status: Never Smoker  . Smokeless tobacco: Never Used  Vaping Use  . Vaping Use: Never used  Substance and Sexual Activity  . Alcohol use: No  . Drug use: No  . Sexual activity: Not on file  Other Topics Concern  . Not on file  Social History Narrative   Lives with husband.  Two children and three grands.     Social Determinants of Health   Financial Resource Strain:   . Difficulty of Paying Living Expenses: Not on file  Food Insecurity:   . Worried About Charity fundraiser in the Last Year: Not on file  . Ran Out of Food in the Last Year: Not on file  Transportation Needs:   . Lack of Transportation (Medical): Not on file  . Lack of Transportation (Non-Medical): Not on file  Physical Activity:   . Days of Exercise per Week: Not on file  . Minutes of Exercise per Session: Not on file  Stress:   . Feeling of Stress : Not on file  Social Connections:   . Frequency of Communication with Friends and Family: Not on file  . Frequency of Social Gatherings with Friends and Family: Not on file  . Attends Religious Services: Not on file  . Active Member of Clubs or Organizations: Not on file  . Attends Archivist Meetings: Not on file  . Marital Status: Not on file     Family History: The patient's family history includes Bladder Cancer in her cousin; Brain cancer in her paternal uncle; Breast cancer in her cousin, paternal aunt, and another family member; Breast cancer (age of onset: 49) in her father and sister; Dementia in her mother; Diabetes in her brother, paternal grandfather, and paternal grandmother; Kidney disease in her maternal grandmother; Melanoma in her maternal grandfather and paternal aunt; Melanoma (age of onset: 18) in her father; Ovarian cancer in an other family member; Pancreatic cancer in her sister; Prostate cancer in her cousin; Prostate cancer (age of onset: 4) in an other family member; Prostate cancer (age  of onset: 55) in her father; Stomach cancer in her paternal aunt. There is no history of Colon cancer.  ROS:   Please see the history of present illness.     All other systems reviewed and are negative.  EKGs/Labs/Other Studies Reviewed:    The following studies were reviewed today:  Echo 05/28/2020 1. Left ventricular ejection fraction, by estimation, is 60 to 65%. The  left ventricle has normal function. The left ventricle has no regional  wall motion abnormalities. There is moderate asymmetric left ventricular  hypertrophy of the  basal-septal  segment. Left ventricular diastolic parameters were normal.  2. Right ventricular systolic function is normal. The right ventricular  size is normal. There is normal pulmonary artery systolic pressure. The  estimated right ventricular systolic pressure is 54.0 mmHg.  3. The mitral valve is normal in structure. Mild mitral valve  regurgitation.  4. The aortic valve is tricuspid. Aortic valve regurgitation is trivial.  No aortic stenosis is present.  5. The inferior vena cava is normal in size with <50% respiratory  variability, suggesting right atrial pressure of 8 mmHg.   EKG:  EKG is ordered today.  The ekg ordered today demonstrates normal sinus rhythm with ventricular trigeminy.  Recent Labs: 04/11/2020: BUN 15; Creatinine, Ser 0.72; Hemoglobin 14.4; Platelets 203; Potassium 3.7; Sodium 141  Recent Lipid Panel No results found for: CHOL, TRIG, HDL, CHOLHDL, VLDL, LDLCALC, LDLDIRECT  Physical Exam:    VS:  BP 114/60   Pulse 74   Ht _0  (1.753 m)   Wt 159 lb (72.1 kg)   SpO2 97%   BMI 23.48 kg/m     Wt Readings from Last 3 Encounters:  06/20/20 159 lb (72.1 kg)  05/14/20 157 lb 9.6 oz (71.5 kg)  01/23/20 161 lb (73 kg)     GEN:  Well nourished, well developed in no acute distress HEENT: Normal NECK: No JVD; No carotid bruits LYMPHATICS: No lymphadenopathy CARDIAC: RRR, no murmurs, rubs, gallops RESPIRATORY:  Clear  to auscultation without rales, wheezing or rhonchi  ABDOMEN: Soft, non-tender, non-distended MUSCULOSKELETAL:  No edema; No deformity  SKIN: Warm and dry NEUROLOGIC:  Alert and oriented x 3 PSYCHIATRIC:  Normal affect   ASSESSMENT:    1. Palpitations   2. Controlled type 2 diabetes mellitus without complication, without long-term current use of insulin (HCC)    PLAN:    In order of problems listed above:  1. Palpitation: Symptom consistent with symptomatic PVCs.  Will start on propranolol 10 mg 3 times daily as needed.  I reassured the patient that PVCs are generally consider harmless.   2. DM2: Managed by primary care provider.   Medication Adjustments/Labs and Tests Ordered: Current medicines are reviewed at length with the patient today.  Concerns regarding medicines are outlined above.  Orders Placed This Encounter  Procedures  . EKG 12-Lead   No orders of the defined types were placed in this encounter.   Patient Instructions  Medication Instructions:   PICK UP your Propanolol from your local pharmacy. Take 1 tablet 3 times a day as needed for palpitations  *If you need a refill on your cardiac medications before your next appointment, please call your pharmacy*  Lab Work: NONE ordered at this time of appointment   If you have labs (blood work) drawn today and your tests are completely normal, you will receive your results only by: Marland Kitchen MyChart Message (if you have MyChart) OR . A paper copy in the mail If you have any lab test that is abnormal or we need to change your treatment, we will call you to review the results.  Testing/Procedures: NONE ordered at this time of appointment   Follow-Up: At [facility], you and your health needs are our priority.  As part of our continuing mission to provide you with exceptional heart care, we have created designated Provider Care Teams.  These Care Teams include your primary Cardiologist (physician) and Advanced Practice  Providers (APPs -  Physician Assistants and Nurse Practitioners) who all work together to provide you  with the care you need, when you need it.  Your next appointment:   1 month(s)  The format for your next appointment:   In Person  Provider:   Minus Breeding, MD or Almyra Deforest, PA-C   Other Instructions   Please a dose of your Propanolol 2-3 hours prior to your next Cardiology office visit     Signed, Almyra Deforest, [place]  06/22/2020 8:18 PM    [place]

## 2020-06-20 NOTE — Telephone Encounter (Signed)
Pt has APP appt today see other phone note ./cy

## 2020-06-22 ENCOUNTER — Encounter: Payer: Self-pay | Admitting: Physician Assistant

## 2020-07-04 DIAGNOSIS — Z23 Encounter for immunization: Secondary | ICD-10-CM | POA: Diagnosis not present

## 2020-07-30 ENCOUNTER — Ambulatory Visit: Payer: PPO | Admitting: Cardiology

## 2020-08-01 NOTE — Progress Notes (Signed)
Cardiology Office Note   Date:  08/02/2020   ID:  Carol Bell, DOB 1951/09/26, MRN [id number]  PCP:  Michael Boston, MD  Cardiologist:   Minus Breeding, MD   Chief Complaint  Patient presents with  . Palpitations      History of Present Illness: Carol Bell is a 69 y.o. female who is referred by Michael Boston, MD for evaluation of palpitations and PVCs.  She was in the emergency room in July for this.  She has no past cardiac history other than a POET (Plain Old Exercise Treadmill) in 2013.  Echo was unremarkable.  Monitor demonstrated rare ectopy.   She was treated with propranolol.   However, she has continued to have palpitations although there is slightly better.  She did not tolerate atenolol.  She felt like a "zombie."  She still feels palpitations particularly at night when she is lying down.  She does not get them with exercise.  She is not having any presyncope or syncope.  She is not having any new shortness of breath, PND or orthopnea.  Has had no weight gain or edema.  Past Medical History:  Diagnosis Date  . Depression   . Diabetes mellitus without complication ([place])   . Family history of BRCA2 gene positive   . Family history of breast cancer   . Family history of ovarian cancer   . Family history of pancreatic cancer   . History of kidney stones     Past Surgical History:  Procedure Laterality Date  . ABDOMINAL HYSTERECTOMY    . BREAST REDUCTION SURGERY  2000  . CARPAL TUNNEL RELEASE  2012  . KNEE ARTHROSCOPY  10-04-02&07-05-04   right  . 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 buPROPion (WELLBUTRIN XL) 300 MG 24 hr tablet Take 300 mg by mouth daily.    . Calcium Carbonate-Vitamin D (CALCIUM 600+D PO) Take 1 tablet by mouth 2 (two) times daily.    .  calcium gluconate 500 MG tablet Take 1 tablet by mouth 3 (three) times daily. Pt takes once daily    . glucosamine-chondroitin 500-400 MG tablet Take 1 tablet by mouth 3 (three) times daily. Pt takes 1 tablet once daily    . metFORMIN (GLUCOPHAGE-XR) 500 MG 24 hr tablet Take 500 mg by mouth 2 (two) times daily.    . Multiple Vitamin (MULTIVITAMIN WITH MINERALS) TABS tablet Take 1 tablet by mouth daily.    . Omega-3 Fatty Acids (FISH OIL) 1000 MG CAPS Take 1 capsule by mouth daily.    . propranolol (INDERAL) 10 MG tablet Take 1 tablet (10 mg total) by mouth 3 (three) times daily as needed (as needed for palpitation). 90 tablet 1  . Turmeric (QC TUMERIC COMPLEX) 500 MG CAPS Take 1 capsule by mouth daily.    Marland Kitchen diltiazem (CARDIZEM) 30 MG tablet Take 1 tablet (30 mg total) by mouth 3 (three) times daily. Every 8 hours 180 tablet 1   No current facility-administered medications for this visit.    Allergies:   Codeine and Other    ROS:  Please see the history of present illness.   Otherwise, review of systems are positive for none.   All other systems are reviewed and negative.    PHYSICAL EXAM: VS:  BP 108/70 (BP Location: Left Arm, Patient Position: Sitting)   Pulse 65   Ht '5\' 9"'  (1.753 m)   Wt 160 lb 12.8 oz (72.9 kg)   SpO2 95%   BMI 23.75 kg/m  , BMI Body mass index is 23.75 kg/m. GENERAL:  Well appearing NECK:  No jugular venous distention, waveform within normal limits, carotid upstroke brisk and symmetric, no bruits, no thyromegaly LUNGS:  Clear to auscultation bilaterally CHEST:  Unremarkable HEART:  PMI not displaced or sustained,S1 and S2 within normal limits, no S3, no S4, no clicks, no rubs, no murmurs ABD:  Flat, positive bowel sounds normal in frequency in pitch, no bruits, no rebound, no guarding, no midline pulsatile mass, no hepatomegaly, no splenomegaly EXT:  2 plus pulses throughout, no edema, no cyanosis no clubbing   EKG:  EKG is not ordered today.    Recent  Labs: 04/11/2020: BUN 15; Creatinine, Ser 0.72; Hemoglobin 14.4; Platelets 203; Potassium 3.7; Sodium 141    Lipid Panel No results found for: CHOL, TRIG, HDL, CHOLHDL, VLDL, LDLCALC, LDLDIRECT    Wt Readings from Last 3 Encounters:  08/02/20 160 lb 12.8 oz (72.9 kg)  06/20/20 159 lb (72.1 kg)  05/14/20 157 lb 9.6 oz (71.5 kg)      Other studies Reviewed: Additional studies/ records that were reviewed today include: None. Review of the above records demonstrates:  Please see elsewhere in the note.     ASSESSMENT AND PLAN:  PALPITATIONS:    Patient is quite distressed about her PVCs.  We discussed management options.  She does not have a very significant burden of PVCs on her monitor so I do not think that ablation would be reasonable with the risk inherent in that.   We discussed possibly using an antiarrhythmic but she understands would be some minimal risk to this as well.  Therefore, she agrees to just try to continue symptomatic control and for that I will try Cardizem 30 mg every 8 hours stopping the propranolol during that time.  She will see if she tolerates this better and whether this has a better result.  If it is not better she will go back to the propranolol and we can reconsider.   Current medicines are reviewed at length with the patient today.  The patient does not have concerns regarding medicines.  The following changes have been made: As above  Labs/ tests ordered today include: None  No orders of the defined types were placed in this encounter.    Disposition:   FU with me in a couple of months.   Signed, Minus Breeding, MD  08/02/2020 11:07 AM    [place]

## 2020-08-02 ENCOUNTER — Ambulatory Visit: Payer: PPO | Admitting: Cardiology

## 2020-08-02 ENCOUNTER — Encounter: Payer: Self-pay | Admitting: Cardiology

## 2020-08-02 ENCOUNTER — Other Ambulatory Visit: Payer: Self-pay

## 2020-08-02 VITALS — BP 108/70 | HR 65 | Ht 69.0 in | Wt 160.8 lb

## 2020-08-02 DIAGNOSIS — R002 Palpitations: Secondary | ICD-10-CM | POA: Diagnosis not present

## 2020-08-02 MED ORDER — DILTIAZEM HCL 30 MG PO TABS
30.0000 mg | ORAL_TABLET | Freq: Three times a day (TID) | ORAL | 1 refills | Status: DC
Start: 2020-08-02 — End: 2022-12-03

## 2020-08-02 NOTE — Patient Instructions (Signed)
Medication Instructions:  START taking Cardizem 30 mg every 8 hours HOLD your propranolol while you are doing the trial of cardizem  *If you need a refill on your cardiac medications before your next appointment, please call your pharmacy*   Lab Work: None ordered If you have labs (blood work) drawn today and your tests are completely normal, you will receive your results only by: Marland Kitchen MyChart Message (if you have MyChart) OR . A paper copy in the mail If you have any lab test that is abnormal or we need to change your treatment, we will call you to review the results.   Testing/Procedures: None ordered   Follow-Up: At [facility], you and your health needs are our priority.  As part of our continuing mission to provide you with exceptional heart care, we have created designated Provider Care Teams.  These Care Teams include your primary Cardiologist (physician) and Advanced Practice Providers (APPs -  Physician Assistants and Nurse Practitioners) who all work together to provide you with the care you need, when you need it.  We recommend signing up for the patient portal called "MyChart".  Sign up information is provided on this After Visit Summary.  MyChart is used to connect with patients for Virtual Visits (Telemedicine).  Patients are able to view lab/test results, encounter notes, upcoming appointments, etc.  Non-urgent messages can be sent to your provider as well.   To learn more about what you can do with MyChart, go to [email or web address].    Your next appointment:   2 month(s)  The format for your next appointment:   In Person  Provider:   You will see one of the following Advanced Practice Providers on your designated Care Team:    Azalee Course, [place]   Other Instructions None

## 2020-09-04 DIAGNOSIS — E119 Type 2 diabetes mellitus without complications: Secondary | ICD-10-CM | POA: Diagnosis not present

## 2020-09-04 DIAGNOSIS — E782 Mixed hyperlipidemia: Secondary | ICD-10-CM | POA: Diagnosis not present

## 2020-09-11 DIAGNOSIS — I493 Ventricular premature depolarization: Secondary | ICD-10-CM | POA: Diagnosis not present

## 2020-09-11 DIAGNOSIS — E1169 Type 2 diabetes mellitus with other specified complication: Secondary | ICD-10-CM | POA: Diagnosis not present

## 2020-09-11 DIAGNOSIS — Z1212 Encounter for screening for malignant neoplasm of rectum: Secondary | ICD-10-CM | POA: Diagnosis not present

## 2020-09-11 DIAGNOSIS — F418 Other specified anxiety disorders: Secondary | ICD-10-CM | POA: Diagnosis not present

## 2020-09-11 DIAGNOSIS — Z0000 Encounter for general adult medical examination without abnormal findings: Secondary | ICD-10-CM | POA: Diagnosis not present

## 2020-09-11 DIAGNOSIS — R82998 Other abnormal findings in urine: Secondary | ICD-10-CM | POA: Diagnosis not present

## 2020-09-11 DIAGNOSIS — Z1331 Encounter for screening for depression: Secondary | ICD-10-CM | POA: Diagnosis not present

## 2020-09-11 DIAGNOSIS — K219 Gastro-esophageal reflux disease without esophagitis: Secondary | ICD-10-CM | POA: Diagnosis not present

## 2020-09-11 DIAGNOSIS — M8580 Other specified disorders of bone density and structure, unspecified site: Secondary | ICD-10-CM | POA: Diagnosis not present

## 2020-09-11 DIAGNOSIS — I700 Atherosclerosis of aorta: Secondary | ICD-10-CM | POA: Diagnosis not present

## 2020-09-11 DIAGNOSIS — E782 Mixed hyperlipidemia: Secondary | ICD-10-CM | POA: Diagnosis not present

## 2020-09-13 ENCOUNTER — Ambulatory Visit: Payer: PPO | Admitting: Cardiology

## 2020-10-03 ENCOUNTER — Ambulatory Visit: Payer: PPO | Admitting: Physician Assistant

## 2020-11-06 ENCOUNTER — Other Ambulatory Visit: Payer: Self-pay | Admitting: Internal Medicine

## 2020-11-06 DIAGNOSIS — Z1231 Encounter for screening mammogram for malignant neoplasm of breast: Secondary | ICD-10-CM

## 2020-12-25 ENCOUNTER — Ambulatory Visit
Admission: RE | Admit: 2020-12-25 | Discharge: 2020-12-25 | Disposition: A | Discharge status: Home / Self Care | Payer: PPO | Source: Ambulatory Visit | Attending: Internal Medicine | Admitting: Internal Medicine

## 2020-12-25 ENCOUNTER — Other Ambulatory Visit: Payer: Self-pay

## 2020-12-25 DIAGNOSIS — Z1231 Encounter for screening mammogram for malignant neoplasm of breast: Secondary | ICD-10-CM

## 2021-01-24 DIAGNOSIS — M79671 Pain in right foot: Secondary | ICD-10-CM | POA: Diagnosis not present

## 2021-01-24 DIAGNOSIS — F418 Other specified anxiety disorders: Secondary | ICD-10-CM | POA: Diagnosis not present

## 2021-01-24 DIAGNOSIS — I700 Atherosclerosis of aorta: Secondary | ICD-10-CM | POA: Diagnosis not present

## 2021-01-24 DIAGNOSIS — E1169 Type 2 diabetes mellitus with other specified complication: Secondary | ICD-10-CM | POA: Diagnosis not present

## 2021-01-24 DIAGNOSIS — E782 Mixed hyperlipidemia: Secondary | ICD-10-CM | POA: Diagnosis not present

## 2021-02-13 ENCOUNTER — Ambulatory Visit: Payer: PPO | Admitting: Podiatry

## 2021-02-13 ENCOUNTER — Other Ambulatory Visit: Payer: Self-pay

## 2021-02-13 ENCOUNTER — Ambulatory Visit (INDEPENDENT_AMBULATORY_CARE_PROVIDER_SITE_OTHER): Payer: PPO

## 2021-02-13 DIAGNOSIS — M79671 Pain in right foot: Secondary | ICD-10-CM | POA: Diagnosis not present

## 2021-02-13 DIAGNOSIS — M79672 Pain in left foot: Secondary | ICD-10-CM | POA: Diagnosis not present

## 2021-02-13 DIAGNOSIS — M722 Plantar fascial fibromatosis: Secondary | ICD-10-CM | POA: Diagnosis not present

## 2021-02-13 DIAGNOSIS — M7671 Peroneal tendinitis, right leg: Secondary | ICD-10-CM | POA: Diagnosis not present

## 2021-02-13 MED ORDER — DICLOFENAC SODIUM 75 MG PO TBEC
75.0000 mg | DELAYED_RELEASE_TABLET | Freq: Two times a day (BID) | ORAL | 2 refills | Status: DC
Start: 2021-02-13 — End: 2022-12-03

## 2021-02-13 NOTE — Progress Notes (Signed)
Subjective:   Patient ID: Carol Bell, female   DOB: 70 y.o.   MRN: [id number]   HPI Patient presents with a lot of pain in the outside of the right ankle and the bottom of the left heel.  States that they are worse in the morning but it hurts at all times and it is gradually becoming more of an issue and making it more difficult for her to walk.  Has been present several months   Review of Systems  All other systems reviewed and are negative.       Objective:  Physical Exam Vitals and nursing note reviewed.  Constitutional:      Appearance: She is well-developed.  Pulmonary:     Effort: Pulmonary effort is normal.  Musculoskeletal:        General: Normal range of motion.  Skin:    General: Skin is warm.  Neurological:     Mental Status: She is alert.     Neurovascular status intact muscle strength adequate range of motion adequate.  Patient is found to have discomfort in the lateral side of the right foot at the base of the fifth metatarsal peroneal insertion and on the left is noted to have pain in the plantar aspect of the heel with inflammation fluid buildup.  Patient is found to have good digital perfusion well oriented x3     Assessment:  Acute peroneal tendinitis right with Planter fasciitis left     Plan:  H&P reviewed both conditions and for the right I did sterile prep and injected the tendon 3 mg Kenalog 5 mg Xylocaine and applied fascial brace to lift up the lateral side of the foot along with aggressive ice.  For the plantar heel left I did do sterile prep and injected the fascia 3 mg Kenalog 5 mg Xylocaine and advised on supportive therapy physical therapy.  Placed on diclofenac 75 mg twice daily reappoint 2 weeks  X-rays indicate small spurs no indication stress fracture arthritis

## 2021-02-26 DIAGNOSIS — H0102B Squamous blepharitis left eye, upper and lower eyelids: Secondary | ICD-10-CM | POA: Diagnosis not present

## 2021-02-26 DIAGNOSIS — H2513 Age-related nuclear cataract, bilateral: Secondary | ICD-10-CM | POA: Diagnosis not present

## 2021-02-26 DIAGNOSIS — H43811 Vitreous degeneration, right eye: Secondary | ICD-10-CM | POA: Diagnosis not present

## 2021-02-26 DIAGNOSIS — E119 Type 2 diabetes mellitus without complications: Secondary | ICD-10-CM | POA: Diagnosis not present

## 2021-02-26 DIAGNOSIS — G43109 Migraine with aura, not intractable, without status migrainosus: Secondary | ICD-10-CM | POA: Diagnosis not present

## 2021-02-26 DIAGNOSIS — H0102A Squamous blepharitis right eye, upper and lower eyelids: Secondary | ICD-10-CM | POA: Diagnosis not present

## 2021-02-28 ENCOUNTER — Other Ambulatory Visit: Payer: Self-pay

## 2021-02-28 ENCOUNTER — Ambulatory Visit: Payer: PPO | Admitting: Podiatry

## 2021-02-28 ENCOUNTER — Encounter: Payer: Self-pay | Admitting: Podiatry

## 2021-02-28 DIAGNOSIS — M722 Plantar fascial fibromatosis: Secondary | ICD-10-CM | POA: Diagnosis not present

## 2021-02-28 DIAGNOSIS — M7671 Peroneal tendinitis, right leg: Secondary | ICD-10-CM | POA: Diagnosis not present

## 2021-03-04 DIAGNOSIS — R300 Dysuria: Secondary | ICD-10-CM | POA: Diagnosis not present

## 2021-03-04 NOTE — Progress Notes (Signed)
Subjective:   Patient ID: Carol Bell, female   DOB: 70 y.o.   MRN: [id number]   HPI Patient presents stating that her foot is feeling much better.  States that she is walking better and able to bear weight completely on the heel at this time neuro   ROS      Objective:  Physical Exam  Vascular status was found to be intact and discomfort into the plantar heel region was found to be reduced by around 80% with mild discomfort only upon deep palpation     Assessment:  Improvement of fasciitis symptomatology     Plan:  H&P performed today and reviewed at great length with patient the continuation of stretching exercises anti-inflammatories shoe gear modification and physical therapy.  Patient will be seen back as needed with education given concerning future

## 2021-04-25 DIAGNOSIS — E1169 Type 2 diabetes mellitus with other specified complication: Secondary | ICD-10-CM | POA: Diagnosis not present

## 2021-04-25 DIAGNOSIS — F418 Other specified anxiety disorders: Secondary | ICD-10-CM | POA: Diagnosis not present

## 2021-04-25 DIAGNOSIS — E782 Mixed hyperlipidemia: Secondary | ICD-10-CM | POA: Diagnosis not present

## 2021-09-11 DIAGNOSIS — E1169 Type 2 diabetes mellitus with other specified complication: Secondary | ICD-10-CM | POA: Diagnosis not present

## 2021-09-11 DIAGNOSIS — E782 Mixed hyperlipidemia: Secondary | ICD-10-CM | POA: Diagnosis not present

## 2021-09-11 DIAGNOSIS — M859 Disorder of bone density and structure, unspecified: Secondary | ICD-10-CM | POA: Diagnosis not present

## 2021-09-18 DIAGNOSIS — E1169 Type 2 diabetes mellitus with other specified complication: Secondary | ICD-10-CM | POA: Diagnosis not present

## 2021-09-18 DIAGNOSIS — M25512 Pain in left shoulder: Secondary | ICD-10-CM | POA: Diagnosis not present

## 2021-09-18 DIAGNOSIS — Z0000 Encounter for general adult medical examination without abnormal findings: Secondary | ICD-10-CM | POA: Diagnosis not present

## 2021-09-18 DIAGNOSIS — M8580 Other specified disorders of bone density and structure, unspecified site: Secondary | ICD-10-CM | POA: Diagnosis not present

## 2021-09-18 DIAGNOSIS — I700 Atherosclerosis of aorta: Secondary | ICD-10-CM | POA: Diagnosis not present

## 2021-09-18 DIAGNOSIS — E782 Mixed hyperlipidemia: Secondary | ICD-10-CM | POA: Diagnosis not present

## 2021-09-18 DIAGNOSIS — K219 Gastro-esophageal reflux disease without esophagitis: Secondary | ICD-10-CM | POA: Diagnosis not present

## 2021-09-18 DIAGNOSIS — Z1331 Encounter for screening for depression: Secondary | ICD-10-CM | POA: Diagnosis not present

## 2021-09-18 DIAGNOSIS — F418 Other specified anxiety disorders: Secondary | ICD-10-CM | POA: Diagnosis not present

## 2021-09-18 DIAGNOSIS — Z1339 Encounter for screening examination for other mental health and behavioral disorders: Secondary | ICD-10-CM | POA: Diagnosis not present

## 2021-09-26 ENCOUNTER — Other Ambulatory Visit: Payer: Self-pay | Admitting: Internal Medicine

## 2021-10-04 ENCOUNTER — Other Ambulatory Visit: Payer: Self-pay | Admitting: Internal Medicine

## 2021-10-04 DIAGNOSIS — M8580 Other specified disorders of bone density and structure, unspecified site: Secondary | ICD-10-CM

## 2021-10-25 ENCOUNTER — Other Ambulatory Visit: Payer: Self-pay | Admitting: Internal Medicine

## 2021-10-25 DIAGNOSIS — Z1231 Encounter for screening mammogram for malignant neoplasm of breast: Secondary | ICD-10-CM

## 2021-12-26 ENCOUNTER — Ambulatory Visit
Admission: RE | Admit: 2021-12-26 | Discharge: 2021-12-26 | Disposition: A | Discharge status: Home / Self Care | Payer: PPO | Source: Ambulatory Visit | Attending: Internal Medicine | Admitting: Internal Medicine

## 2021-12-26 DIAGNOSIS — Z1231 Encounter for screening mammogram for malignant neoplasm of breast: Secondary | ICD-10-CM | POA: Diagnosis not present

## 2021-12-30 DIAGNOSIS — R300 Dysuria: Secondary | ICD-10-CM | POA: Diagnosis not present

## 2021-12-31 ENCOUNTER — Other Ambulatory Visit: Payer: Self-pay | Admitting: Internal Medicine

## 2021-12-31 ENCOUNTER — Other Ambulatory Visit: Payer: PPO

## 2021-12-31 ENCOUNTER — Ambulatory Visit
Admission: RE | Admit: 2021-12-31 | Discharge: 2021-12-31 | Disposition: A | Discharge status: Home / Self Care | Payer: PPO | Source: Ambulatory Visit | Attending: Internal Medicine | Admitting: Internal Medicine

## 2021-12-31 DIAGNOSIS — N200 Calculus of kidney: Secondary | ICD-10-CM

## 2021-12-31 DIAGNOSIS — R109 Unspecified abdominal pain: Secondary | ICD-10-CM | POA: Diagnosis not present

## 2022-01-17 DIAGNOSIS — I700 Atherosclerosis of aorta: Secondary | ICD-10-CM | POA: Diagnosis not present

## 2022-01-17 DIAGNOSIS — M549 Dorsalgia, unspecified: Secondary | ICD-10-CM | POA: Diagnosis not present

## 2022-01-17 DIAGNOSIS — E782 Mixed hyperlipidemia: Secondary | ICD-10-CM | POA: Diagnosis not present

## 2022-01-17 DIAGNOSIS — R309 Painful micturition, unspecified: Secondary | ICD-10-CM | POA: Diagnosis not present

## 2022-01-17 DIAGNOSIS — E1169 Type 2 diabetes mellitus with other specified complication: Secondary | ICD-10-CM | POA: Diagnosis not present

## 2022-02-12 DIAGNOSIS — M9905 Segmental and somatic dysfunction of pelvic region: Secondary | ICD-10-CM | POA: Diagnosis not present

## 2022-02-12 DIAGNOSIS — M9901 Segmental and somatic dysfunction of cervical region: Secondary | ICD-10-CM | POA: Diagnosis not present

## 2022-02-12 DIAGNOSIS — M25551 Pain in right hip: Secondary | ICD-10-CM | POA: Diagnosis not present

## 2022-02-12 DIAGNOSIS — M50323 Other cervical disc degeneration at C6-C7 level: Secondary | ICD-10-CM | POA: Diagnosis not present

## 2022-02-13 DIAGNOSIS — M9901 Segmental and somatic dysfunction of cervical region: Secondary | ICD-10-CM | POA: Diagnosis not present

## 2022-02-13 DIAGNOSIS — M25551 Pain in right hip: Secondary | ICD-10-CM | POA: Diagnosis not present

## 2022-02-13 DIAGNOSIS — M9905 Segmental and somatic dysfunction of pelvic region: Secondary | ICD-10-CM | POA: Diagnosis not present

## 2022-02-13 DIAGNOSIS — M50323 Other cervical disc degeneration at C6-C7 level: Secondary | ICD-10-CM | POA: Diagnosis not present

## 2022-02-17 DIAGNOSIS — M9905 Segmental and somatic dysfunction of pelvic region: Secondary | ICD-10-CM | POA: Diagnosis not present

## 2022-02-17 DIAGNOSIS — M9901 Segmental and somatic dysfunction of cervical region: Secondary | ICD-10-CM | POA: Diagnosis not present

## 2022-02-17 DIAGNOSIS — M25551 Pain in right hip: Secondary | ICD-10-CM | POA: Diagnosis not present

## 2022-02-17 DIAGNOSIS — M50323 Other cervical disc degeneration at C6-C7 level: Secondary | ICD-10-CM | POA: Diagnosis not present

## 2022-02-18 DIAGNOSIS — M25551 Pain in right hip: Secondary | ICD-10-CM | POA: Diagnosis not present

## 2022-02-18 DIAGNOSIS — M9905 Segmental and somatic dysfunction of pelvic region: Secondary | ICD-10-CM | POA: Diagnosis not present

## 2022-02-18 DIAGNOSIS — M9901 Segmental and somatic dysfunction of cervical region: Secondary | ICD-10-CM | POA: Diagnosis not present

## 2022-02-18 DIAGNOSIS — M50323 Other cervical disc degeneration at C6-C7 level: Secondary | ICD-10-CM | POA: Diagnosis not present

## 2022-02-19 DIAGNOSIS — R3982 Chronic bladder pain: Secondary | ICD-10-CM | POA: Diagnosis not present

## 2022-02-19 DIAGNOSIS — M47816 Spondylosis without myelopathy or radiculopathy, lumbar region: Secondary | ICD-10-CM | POA: Diagnosis not present

## 2022-02-19 DIAGNOSIS — M47812 Spondylosis without myelopathy or radiculopathy, cervical region: Secondary | ICD-10-CM | POA: Diagnosis not present

## 2022-02-20 DIAGNOSIS — M9901 Segmental and somatic dysfunction of cervical region: Secondary | ICD-10-CM | POA: Diagnosis not present

## 2022-02-20 DIAGNOSIS — M9905 Segmental and somatic dysfunction of pelvic region: Secondary | ICD-10-CM | POA: Diagnosis not present

## 2022-02-20 DIAGNOSIS — M50323 Other cervical disc degeneration at C6-C7 level: Secondary | ICD-10-CM | POA: Diagnosis not present

## 2022-02-20 DIAGNOSIS — M25551 Pain in right hip: Secondary | ICD-10-CM | POA: Diagnosis not present

## 2022-02-25 DIAGNOSIS — M9901 Segmental and somatic dysfunction of cervical region: Secondary | ICD-10-CM | POA: Diagnosis not present

## 2022-02-25 DIAGNOSIS — M25551 Pain in right hip: Secondary | ICD-10-CM | POA: Diagnosis not present

## 2022-02-25 DIAGNOSIS — M50323 Other cervical disc degeneration at C6-C7 level: Secondary | ICD-10-CM | POA: Diagnosis not present

## 2022-02-25 DIAGNOSIS — M9905 Segmental and somatic dysfunction of pelvic region: Secondary | ICD-10-CM | POA: Diagnosis not present

## 2022-02-26 DIAGNOSIS — M9905 Segmental and somatic dysfunction of pelvic region: Secondary | ICD-10-CM | POA: Diagnosis not present

## 2022-02-26 DIAGNOSIS — M9901 Segmental and somatic dysfunction of cervical region: Secondary | ICD-10-CM | POA: Diagnosis not present

## 2022-02-26 DIAGNOSIS — M25551 Pain in right hip: Secondary | ICD-10-CM | POA: Diagnosis not present

## 2022-02-26 DIAGNOSIS — M50323 Other cervical disc degeneration at C6-C7 level: Secondary | ICD-10-CM | POA: Diagnosis not present

## 2022-02-27 DIAGNOSIS — M25551 Pain in right hip: Secondary | ICD-10-CM | POA: Diagnosis not present

## 2022-02-27 DIAGNOSIS — M9901 Segmental and somatic dysfunction of cervical region: Secondary | ICD-10-CM | POA: Diagnosis not present

## 2022-02-27 DIAGNOSIS — M50323 Other cervical disc degeneration at C6-C7 level: Secondary | ICD-10-CM | POA: Diagnosis not present

## 2022-02-27 DIAGNOSIS — M9905 Segmental and somatic dysfunction of pelvic region: Secondary | ICD-10-CM | POA: Diagnosis not present

## 2022-03-03 DIAGNOSIS — M50323 Other cervical disc degeneration at C6-C7 level: Secondary | ICD-10-CM | POA: Diagnosis not present

## 2022-03-03 DIAGNOSIS — M9905 Segmental and somatic dysfunction of pelvic region: Secondary | ICD-10-CM | POA: Diagnosis not present

## 2022-03-03 DIAGNOSIS — M25551 Pain in right hip: Secondary | ICD-10-CM | POA: Diagnosis not present

## 2022-03-03 DIAGNOSIS — M9901 Segmental and somatic dysfunction of cervical region: Secondary | ICD-10-CM | POA: Diagnosis not present

## 2022-03-04 DIAGNOSIS — G43109 Migraine with aura, not intractable, without status migrainosus: Secondary | ICD-10-CM | POA: Diagnosis not present

## 2022-03-04 DIAGNOSIS — H43811 Vitreous degeneration, right eye: Secondary | ICD-10-CM | POA: Diagnosis not present

## 2022-03-04 DIAGNOSIS — H0102B Squamous blepharitis left eye, upper and lower eyelids: Secondary | ICD-10-CM | POA: Diagnosis not present

## 2022-03-04 DIAGNOSIS — H0102A Squamous blepharitis right eye, upper and lower eyelids: Secondary | ICD-10-CM | POA: Diagnosis not present

## 2022-03-04 DIAGNOSIS — H2513 Age-related nuclear cataract, bilateral: Secondary | ICD-10-CM | POA: Diagnosis not present

## 2022-03-04 DIAGNOSIS — E119 Type 2 diabetes mellitus without complications: Secondary | ICD-10-CM | POA: Diagnosis not present

## 2022-03-10 DIAGNOSIS — M9901 Segmental and somatic dysfunction of cervical region: Secondary | ICD-10-CM | POA: Diagnosis not present

## 2022-03-10 DIAGNOSIS — M25551 Pain in right hip: Secondary | ICD-10-CM | POA: Diagnosis not present

## 2022-03-10 DIAGNOSIS — M9905 Segmental and somatic dysfunction of pelvic region: Secondary | ICD-10-CM | POA: Diagnosis not present

## 2022-03-10 DIAGNOSIS — M50323 Other cervical disc degeneration at C6-C7 level: Secondary | ICD-10-CM | POA: Diagnosis not present

## 2022-03-11 DIAGNOSIS — M25551 Pain in right hip: Secondary | ICD-10-CM | POA: Diagnosis not present

## 2022-03-11 DIAGNOSIS — M9905 Segmental and somatic dysfunction of pelvic region: Secondary | ICD-10-CM | POA: Diagnosis not present

## 2022-03-11 DIAGNOSIS — M50323 Other cervical disc degeneration at C6-C7 level: Secondary | ICD-10-CM | POA: Diagnosis not present

## 2022-03-11 DIAGNOSIS — M9901 Segmental and somatic dysfunction of cervical region: Secondary | ICD-10-CM | POA: Diagnosis not present

## 2022-03-20 ENCOUNTER — Ambulatory Visit
Admission: RE | Admit: 2022-03-20 | Discharge: 2022-03-20 | Disposition: A | Discharge status: Home / Self Care | Payer: PPO | Source: Ambulatory Visit | Attending: Internal Medicine | Admitting: Internal Medicine

## 2022-03-20 DIAGNOSIS — M8580 Other specified disorders of bone density and structure, unspecified site: Secondary | ICD-10-CM

## 2022-03-20 DIAGNOSIS — M25551 Pain in right hip: Secondary | ICD-10-CM | POA: Diagnosis not present

## 2022-03-20 DIAGNOSIS — Z780 Asymptomatic menopausal state: Secondary | ICD-10-CM | POA: Diagnosis not present

## 2022-03-20 DIAGNOSIS — M9905 Segmental and somatic dysfunction of pelvic region: Secondary | ICD-10-CM | POA: Diagnosis not present

## 2022-03-20 DIAGNOSIS — M9901 Segmental and somatic dysfunction of cervical region: Secondary | ICD-10-CM | POA: Diagnosis not present

## 2022-03-20 DIAGNOSIS — M85852 Other specified disorders of bone density and structure, left thigh: Secondary | ICD-10-CM | POA: Diagnosis not present

## 2022-03-20 DIAGNOSIS — M50323 Other cervical disc degeneration at C6-C7 level: Secondary | ICD-10-CM | POA: Diagnosis not present

## 2022-03-25 DIAGNOSIS — M50323 Other cervical disc degeneration at C6-C7 level: Secondary | ICD-10-CM | POA: Diagnosis not present

## 2022-03-25 DIAGNOSIS — M9905 Segmental and somatic dysfunction of pelvic region: Secondary | ICD-10-CM | POA: Diagnosis not present

## 2022-03-25 DIAGNOSIS — M9901 Segmental and somatic dysfunction of cervical region: Secondary | ICD-10-CM | POA: Diagnosis not present

## 2022-03-25 DIAGNOSIS — M25551 Pain in right hip: Secondary | ICD-10-CM | POA: Diagnosis not present

## 2022-03-27 DIAGNOSIS — M50323 Other cervical disc degeneration at C6-C7 level: Secondary | ICD-10-CM | POA: Diagnosis not present

## 2022-03-27 DIAGNOSIS — M9905 Segmental and somatic dysfunction of pelvic region: Secondary | ICD-10-CM | POA: Diagnosis not present

## 2022-03-27 DIAGNOSIS — M9901 Segmental and somatic dysfunction of cervical region: Secondary | ICD-10-CM | POA: Diagnosis not present

## 2022-03-27 DIAGNOSIS — M25551 Pain in right hip: Secondary | ICD-10-CM | POA: Diagnosis not present

## 2022-04-03 DIAGNOSIS — M9905 Segmental and somatic dysfunction of pelvic region: Secondary | ICD-10-CM | POA: Diagnosis not present

## 2022-04-03 DIAGNOSIS — M9901 Segmental and somatic dysfunction of cervical region: Secondary | ICD-10-CM | POA: Diagnosis not present

## 2022-04-03 DIAGNOSIS — M50323 Other cervical disc degeneration at C6-C7 level: Secondary | ICD-10-CM | POA: Diagnosis not present

## 2022-04-03 DIAGNOSIS — M25551 Pain in right hip: Secondary | ICD-10-CM | POA: Diagnosis not present

## 2022-04-10 DIAGNOSIS — M9901 Segmental and somatic dysfunction of cervical region: Secondary | ICD-10-CM | POA: Diagnosis not present

## 2022-04-10 DIAGNOSIS — M50323 Other cervical disc degeneration at C6-C7 level: Secondary | ICD-10-CM | POA: Diagnosis not present

## 2022-04-10 DIAGNOSIS — M9905 Segmental and somatic dysfunction of pelvic region: Secondary | ICD-10-CM | POA: Diagnosis not present

## 2022-04-10 DIAGNOSIS — M25551 Pain in right hip: Secondary | ICD-10-CM | POA: Diagnosis not present

## 2022-04-17 DIAGNOSIS — M25551 Pain in right hip: Secondary | ICD-10-CM | POA: Diagnosis not present

## 2022-04-17 DIAGNOSIS — M9901 Segmental and somatic dysfunction of cervical region: Secondary | ICD-10-CM | POA: Diagnosis not present

## 2022-04-17 DIAGNOSIS — M50323 Other cervical disc degeneration at C6-C7 level: Secondary | ICD-10-CM | POA: Diagnosis not present

## 2022-04-17 DIAGNOSIS — M9905 Segmental and somatic dysfunction of pelvic region: Secondary | ICD-10-CM | POA: Diagnosis not present

## 2022-05-29 DIAGNOSIS — E1169 Type 2 diabetes mellitus with other specified complication: Secondary | ICD-10-CM | POA: Diagnosis not present

## 2022-05-29 DIAGNOSIS — R109 Unspecified abdominal pain: Secondary | ICD-10-CM | POA: Diagnosis not present

## 2022-05-29 DIAGNOSIS — M8580 Other specified disorders of bone density and structure, unspecified site: Secondary | ICD-10-CM | POA: Diagnosis not present

## 2022-07-12 DIAGNOSIS — Z23 Encounter for immunization: Secondary | ICD-10-CM | POA: Diagnosis not present

## 2022-07-29 DIAGNOSIS — K0499 Other diseases of pulp and periapical tissues: Secondary | ICD-10-CM | POA: Diagnosis not present

## 2022-09-18 DIAGNOSIS — E782 Mixed hyperlipidemia: Secondary | ICD-10-CM | POA: Diagnosis not present

## 2022-09-18 DIAGNOSIS — R7989 Other specified abnormal findings of blood chemistry: Secondary | ICD-10-CM | POA: Diagnosis not present

## 2022-09-18 DIAGNOSIS — I700 Atherosclerosis of aorta: Secondary | ICD-10-CM | POA: Diagnosis not present

## 2022-09-18 DIAGNOSIS — E1169 Type 2 diabetes mellitus with other specified complication: Secondary | ICD-10-CM | POA: Diagnosis not present

## 2022-09-18 DIAGNOSIS — M8580 Other specified disorders of bone density and structure, unspecified site: Secondary | ICD-10-CM | POA: Diagnosis not present

## 2022-09-25 DIAGNOSIS — R0981 Nasal congestion: Secondary | ICD-10-CM | POA: Diagnosis not present

## 2022-09-25 DIAGNOSIS — J101 Influenza due to other identified influenza virus with other respiratory manifestations: Secondary | ICD-10-CM | POA: Diagnosis not present

## 2022-09-25 DIAGNOSIS — G43909 Migraine, unspecified, not intractable, without status migrainosus: Secondary | ICD-10-CM | POA: Diagnosis not present

## 2022-09-25 DIAGNOSIS — R059 Cough, unspecified: Secondary | ICD-10-CM | POA: Diagnosis not present

## 2022-09-25 DIAGNOSIS — K219 Gastro-esophageal reflux disease without esophagitis: Secondary | ICD-10-CM | POA: Diagnosis not present

## 2022-09-25 DIAGNOSIS — E1169 Type 2 diabetes mellitus with other specified complication: Secondary | ICD-10-CM | POA: Diagnosis not present

## 2022-11-04 ENCOUNTER — Encounter: Payer: Self-pay | Admitting: Gastroenterology

## 2022-11-12 ENCOUNTER — Other Ambulatory Visit: Payer: Self-pay | Admitting: Internal Medicine

## 2022-11-12 DIAGNOSIS — Z1231 Encounter for screening mammogram for malignant neoplasm of breast: Secondary | ICD-10-CM

## 2022-11-18 DIAGNOSIS — R1030 Lower abdominal pain, unspecified: Secondary | ICD-10-CM | POA: Diagnosis not present

## 2022-11-18 DIAGNOSIS — E782 Mixed hyperlipidemia: Secondary | ICD-10-CM | POA: Diagnosis not present

## 2022-11-18 DIAGNOSIS — Z0000 Encounter for general adult medical examination without abnormal findings: Secondary | ICD-10-CM | POA: Diagnosis not present

## 2022-11-18 DIAGNOSIS — I700 Atherosclerosis of aorta: Secondary | ICD-10-CM | POA: Diagnosis not present

## 2022-11-18 DIAGNOSIS — K219 Gastro-esophageal reflux disease without esophagitis: Secondary | ICD-10-CM | POA: Diagnosis not present

## 2022-11-18 DIAGNOSIS — E1169 Type 2 diabetes mellitus with other specified complication: Secondary | ICD-10-CM | POA: Diagnosis not present

## 2022-11-18 DIAGNOSIS — M8580 Other specified disorders of bone density and structure, unspecified site: Secondary | ICD-10-CM | POA: Diagnosis not present

## 2022-11-18 DIAGNOSIS — F418 Other specified anxiety disorders: Secondary | ICD-10-CM | POA: Diagnosis not present

## 2022-11-18 DIAGNOSIS — M549 Dorsalgia, unspecified: Secondary | ICD-10-CM | POA: Diagnosis not present

## 2022-12-03 ENCOUNTER — Ambulatory Visit: Payer: PPO | Admitting: Gastroenterology

## 2022-12-03 ENCOUNTER — Encounter: Payer: Self-pay | Admitting: Gastroenterology

## 2022-12-03 VITALS — BP 100/60 | HR 80 | Ht 67.5 in | Wt 152.1 lb

## 2022-12-03 DIAGNOSIS — R1031 Right lower quadrant pain: Secondary | ICD-10-CM | POA: Diagnosis not present

## 2022-12-03 DIAGNOSIS — Z1211 Encounter for screening for malignant neoplasm of colon: Secondary | ICD-10-CM | POA: Diagnosis not present

## 2022-12-03 MED ORDER — NA SULFATE-K SULFATE-MG SULF 17.5-3.13-1.6 GM/177ML PO SOLN: 1.0000 | Freq: Once | ORAL | 0 refills | Status: AC

## 2022-12-03 MED ORDER — HYOSCYAMINE SULFATE 0.125 MG PO TABS: 0.1250 mg | ORAL_TABLET | Freq: Four times a day (QID) | ORAL | 1 refills | Status: AC | PRN

## 2022-12-03 NOTE — Progress Notes (Signed)
12/03/2022 Carol Bell [id number] Jan 01, 1951   HISTORY OF PRESENT ILLNESS: This is a 72 year old female who is previously a patient of Dr. Kelby Fam.  She had colonoscopy in March 2014 that was normal at that time.  She is here to discuss and schedule another colonoscopy.  She does have a history of IBS, but has not been seen for management of this.  She says that she started taking a probiotic and that has helped.  She does have complaints of right lower quadrant abdominal pain that radiates across her lower abdomen intermittently.  It looks like she had the same complaint back in 2018 and a CT scan of the abdomen and pelvis with contrast was ordered by her PCP, which was unremarkable.  She says that this pain comes and goes.  She also has a history of kidney stones and has followed up with her urologist.  They said they do not see any current issues causing the pain.  Says she moves her bowels regularly.  No rectal bleeding.   Past Medical History:  Diagnosis Date   Arthritis    Depression    Diabetes mellitus without complication ([facility])    Family history of BRCA2 gene positive    Family history of breast cancer    Family history of ovarian cancer    Family history of pancreatic cancer    GERD (gastroesophageal reflux disease)    History of kidney stones    HLD (hyperlipidemia)    IBS (irritable bowel syndrome)    PVC (premature ventricular contraction)    Past Surgical History:  Procedure Laterality Date   ABDOMINAL HYSTERECTOMY     BREAST REDUCTION SURGERY  2000   CARPAL TUNNEL RELEASE Right 2012   KNEE ARTHROSCOPY  10-04-02&07-05-04   right   TONSILLECTOMY     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 She has never used smokeless tobacco. She reports that she does not drink alcohol and does not use  drugs. family history includes Alzheimer's disease in her mother; Arthritis in her daughter; Bladder Cancer in her cousin; Brain cancer in her paternal uncle; Breast cancer in her cousin, paternal aunt, and another family member; Breast cancer (age of onset: 55) in her father and sister; Crohn's disease in her sister; Dementia in her mother; Diabetes in her brother, father, mother, nephew, paternal grandfather, and paternal grandmother; Heart disease in her brother; Irritable bowel syndrome in her daughter, daughter, and sister; Kidney disease in her maternal grandmother; Melanoma in her maternal grandfather and paternal aunt; Melanoma (age of onset: 45) in her father; Ovarian cancer in an other family member; Pancreatic cancer in her sister; Prostate cancer in her cousin; Prostate cancer (age of onset: 72) in an other family member; Prostate cancer (age of onset: 18) in her father; Stomach cancer in her paternal aunt. Allergies  Allergen Reactions   Codeine Nausea Only and Other (See Comments)    "looply"   Other Nausea And Vomiting    general anesthesia       Outpatient Encounter Medications as of 12/03/2022  Medication Sig   Berberine Chloride (BERBERINE HCI PO) Take 1,000 mg by mouth 2 (two) times daily.   buPROPion (WELLBUTRIN XL) 300 MG 24 hr tablet Take 300 mg by mouth daily.   Calcium Carbonate-Vitamin D (CALCIUM 600+D PO) Take 1  tablet by mouth 2 (two) times daily.   calcium gluconate 500 MG tablet Take 1 tablet by mouth 3 (three) times daily. Pt takes once daily   Cyanocobalamin (VITAMIN B-12) 5000 MCG TBDP Take 1 tablet by mouth daily.   glucosamine-chondroitin 500-400 MG tablet Take 1 tablet by mouth 3 (three) times daily. Pt takes 1 tablet once daily   metFORMIN (GLUCOPHAGE-XR) 500 MG 24 hr tablet Take 500 mg by mouth 2 (two) times daily.   Multiple Vitamin (MULTIVITAMIN WITH MINERALS) TABS tablet Take 1 tablet by mouth daily.   Omega-3 Fatty Acids (FISH OIL) 1000 MG CAPS Take 1  capsule by mouth daily.   omeprazole (PRILOSEC) 20 MG capsule Take 20 mg by mouth daily.   rosuvastatin (CRESTOR) 10 MG tablet Take 10 mg by mouth at bedtime.   Turmeric (QC TUMERIC COMPLEX) 500 MG CAPS Take 1 capsule by mouth daily.   [DISCONTINUED] diclofenac (VOLTAREN) 75 MG EC tablet Take 1 tablet (75 mg total) by mouth 2 (two) times daily.   [DISCONTINUED] diltiazem (CARDIZEM) 30 MG tablet Take 1 tablet (30 mg total) by mouth 3 (three) times daily. Every 8 hours   [DISCONTINUED] propranolol (INDERAL) 10 MG tablet Take 1 tablet (10 mg total) by mouth 3 (three) times daily as needed (as needed for palpitation).   No facility-administered encounter medications on file as of 12/03/2022.     REVIEW OF SYSTEMS  : All other systems reviewed and negative except where noted in the History of Present Illness.   PHYSICAL EXAM: BP 100/60 (BP Location: Left Arm, Patient Position: Sitting, Cuff Size: Normal)   Pulse 80   Ht 5' 7.5" (1.715 m) Comment: height measured without shoes  Wt 152 lb 2 oz (69 kg)   BMI 23.47 kg/m  General: Well developed white female in no acute distress Head: Normocephalic and atraumatic Eyes:  Sclerae anicteric, conjunctiva pink. Ears: Normal auditory acuity Lungs: Clear throughout to auscultation; no W/R/R. Heart: Regular rate and rhythm; no M/R/G. Abdomen: Soft, non-distended.  BS present.  RLQ TTP and some suprapubic tenderness as well. Rectal:  Will be done at the time of colonoscopy. Musculoskeletal: Symmetrical with no gross deformities  Skin: No lesions on visible extremities Extremities: No edema  Neurological: Alert oriented x 4, grossly non-focal Psychological:  Alert and cooperative. Normal mood and affect  ASSESSMENT AND PLAN: *CRC screening:  Last colonoscopy 11/2012 with Dr. Deatra Ina.  Will schedule with Dr. Rush Landmark. *RLQ abdominal pain:  Radiates across her lower abdomen.  Comes and goes for years as she had the same in 2018 with negative CT scan.   ? IBS with spasm/cramping (does have history of IBS).  Will try levsin 0.125 mg SL every 6 hours prn.  Prescription sent to pharmacy.  **The risks, benefits, and alternatives to colonoscopy were discussed with the patient and she consents to proceed.   CC:  Michael Boston, MD

## 2022-12-03 NOTE — Progress Notes (Signed)
Attending Physician's Attestation   I have reviewed the chart.   I agree with the Advanced Practitioner's note, impression, and recommendations with any updates as below.    Jahliyah Trice Mansouraty, MD  Gastroenterology Advanced Endoscopy Office # [phone]  

## 2022-12-03 NOTE — Patient Instructions (Addendum)
You have been scheduled for a colonoscopy. Please follow written instructions given to you at your visit today.  Please pick up your prep supplies at the pharmacy within the next 1-3 days. If you use inhalers (even only as needed), please bring them with you on the day of your procedure.   We have sent the following medications to your pharmacy for you to pick up at your convenience: Levsin   Due to recent changes in healthcare laws, you may see the results of your imaging and laboratory studies on MyChart before your provider has had a chance to review them.  We understand that in some cases there may be results that are confusing or concerning to you. Not all laboratory results come back in the same time frame and the provider may be waiting for multiple results in order to interpret others.  Please give [place] 48 hours in order for your provider to thoroughly review all the results before contacting the office for clarification of your results.    It was a pleasure to see you today!  Thank you for trusting me with your gastrointestinal care!

## 2022-12-29 ENCOUNTER — Ambulatory Visit
Admission: RE | Admit: 2022-12-29 | Discharge: 2022-12-29 | Disposition: A | Discharge status: Home / Self Care | Payer: PPO | Source: Ambulatory Visit | Attending: Internal Medicine | Admitting: Internal Medicine

## 2022-12-29 DIAGNOSIS — Z1231 Encounter for screening mammogram for malignant neoplasm of breast: Secondary | ICD-10-CM

## 2023-02-04 ENCOUNTER — Ambulatory Visit (AMBULATORY_SURGERY_CENTER): Payer: PPO | Admitting: Gastroenterology

## 2023-02-04 ENCOUNTER — Encounter: Payer: Self-pay | Admitting: Gastroenterology

## 2023-02-04 VITALS — BP 141/69 | HR 58 | Temp 97.3°F | Resp 14 | Ht 67.5 in | Wt 152.0 lb

## 2023-02-04 DIAGNOSIS — D122 Benign neoplasm of ascending colon: Secondary | ICD-10-CM

## 2023-02-04 DIAGNOSIS — Z1211 Encounter for screening for malignant neoplasm of colon: Secondary | ICD-10-CM | POA: Diagnosis not present

## 2023-02-04 DIAGNOSIS — K635 Polyp of colon: Secondary | ICD-10-CM | POA: Diagnosis not present

## 2023-02-04 DIAGNOSIS — D123 Benign neoplasm of transverse colon: Secondary | ICD-10-CM | POA: Diagnosis not present

## 2023-02-04 DIAGNOSIS — D124 Benign neoplasm of descending colon: Secondary | ICD-10-CM | POA: Diagnosis not present

## 2023-02-04 MED ORDER — SODIUM CHLORIDE 0.9 % IV SOLN
500.0000 mL | Freq: Once | INTRAVENOUS | Status: DC
Start: 2023-02-04 — End: 2023-02-04

## 2023-02-04 NOTE — Patient Instructions (Signed)
Please read handouts provided. Continue present medications. Await pathology results. High Fiber Diet. Use FiberCon 1-2 tablets daily.   YOU HAD AN ENDOSCOPIC PROCEDURE TODAY AT THE [facility]:   Refer to the procedure report that was given to you for any specific questions about what was found during the examination.  If the procedure report does not answer your questions, please call your gastroenterologist to clarify.  If you requested that your care partner not be given the details of your procedure findings, then the procedure report has been included in a sealed envelope for you to review at your convenience later.  YOU SHOULD EXPECT: Some feelings of bloating in the abdomen. Passage of more gas than usual.  Walking can help get rid of the air that was put into your GI tract during the procedure and reduce the bloating. If you had a lower endoscopy (such as a colonoscopy or flexible sigmoidoscopy) you may notice spotting of blood in your stool or on the toilet paper. If you underwent a bowel prep for your procedure, you may not have a normal bowel movement for a few days.  Please Note:  You might notice some irritation and congestion in your nose or some drainage.  This is from the oxygen used during your procedure.  There is no need for concern and it should clear up in a day or so.  SYMPTOMS TO REPORT IMMEDIATELY:  Following lower endoscopy (colonoscopy or flexible sigmoidoscopy):  Excessive amounts of blood in the stool  Significant tenderness or worsening of abdominal pains  Swelling of the abdomen that is new, acute  Fever of 100F or higher   For urgent or emergent issues, a gastroenterologist can be reached at any hour by calling [phone]. Do not use MyChart messaging for urgent concerns.    DIET:  We do recommend a small meal at first, but then you may proceed to your regular diet.  Drink plenty of fluids but you should avoid alcoholic beverages for 24  hours.  ACTIVITY:  You should plan to take it easy for the rest of today and you should NOT DRIVE or use heavy machinery until tomorrow (because of the sedation medicines used during the test).    FOLLOW UP: Our staff will call the number listed on your records the next business day following your procedure.  We will call around 7:15- 8:00 am to check on you and address any questions or concerns that you may have regarding the information given to you following your procedure. If we do not reach you, we will leave a message.     If any biopsies were taken you will be contacted by phone or by letter within the next 1-3 weeks.  Please call us at [phone] if you have not heard about the biopsies in 3 weeks.    SIGNATURES/CONFIDENTIALITY: You and/or your care partner have signed paperwork which will be entered into your electronic medical record.  These signatures attest to the fact that that the information above on your After Visit Summary has been reviewed and is understood.  Full responsibility of the confidentiality of this discharge information lies with you and/or your care-partner. 

## 2023-02-04 NOTE — Progress Notes (Signed)
GASTROENTEROLOGY PROCEDURE H&P NOTE   Primary Care Physician: Melida Quitter, MD  HPI: Carol Bell is a 72 y.o. female who presents for Colonoscopy for screening with episodes of RLQ pain at times.  Past Medical History:  Diagnosis Date   Arthritis    Depression    Diabetes mellitus without complication ([facility])    Family history of BRCA2 gene positive    Family history of breast cancer    Family history of ovarian cancer    Family history of pancreatic cancer    GERD (gastroesophageal reflux disease)    History of kidney stones    HLD (hyperlipidemia)    IBS (irritable bowel syndrome)    Osteoporosis    PVC (premature ventricular contraction)    Past Surgical History:  Procedure Laterality Date   ABDOMINAL HYSTERECTOMY     BREAST REDUCTION SURGERY  2000   CARPAL TUNNEL RELEASE Right 2012   COLONOSCOPY     KNEE ARTHROSCOPY  10-04-02&07-05-04   right   TONSILLECTOMY     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     Berberine Chloride (BERBERINE HCI PO) Take 1,000 mg by mouth 2 (two) times daily.     buPROPion (WELLBUTRIN XL) 300 MG 24 hr tablet Take 300 mg by mouth daily.     Calcium Carbonate-Vitamin D (CALCIUM 600+D PO) Take 1 tablet by mouth 2 (two) times daily.     calcium gluconate 500 MG tablet Take 1 tablet by mouth 3 (three) times daily. Pt takes once daily     Cyanocobalamin (VITAMIN B-12) 5000 MCG TBDP Take 1 tablet by mouth daily.     glucosamine-chondroitin 500-400 MG tablet Take 1 tablet by mouth 3 (three) times daily. Pt takes 1 tablet once daily     hyoscyamine (LEVSIN) 0.125 MG tablet Take 1 tablet (0.125 mg total) by mouth every 6 (six) hours as needed for cramping (diarrhea,nausea). 30 tablet 1    metFORMIN (GLUCOPHAGE-XR) 500 MG 24 hr tablet Take 500 mg by mouth 2 (two) times daily.     Multiple Vitamin (MULTIVITAMIN WITH MINERALS) TABS tablet Take 1 tablet by mouth daily.     Omega-3 Fatty Acids (FISH OIL) 1000 MG CAPS Take 1 capsule by mouth daily.     omeprazole (PRILOSEC) 20 MG capsule Take 20 mg by mouth daily.     rosuvastatin (CRESTOR) 10 MG tablet Take 10 mg by mouth at bedtime.     Turmeric (QC TUMERIC COMPLEX) 500 MG CAPS Take 1 capsule by mouth daily.     Current Facility-Administered Medications  Medication Dose Route Frequency Provider Last Rate Last Admin   0.9 %  sodium chloride infusion  500 mL Intravenous Once Mansouraty, Netty Starring., MD        Current Outpatient Medications:    ONETOUCH ULTRA test strip, 1 each by Other route as needed., Disp: , Rfl:    Berberine Chloride (BERBERINE HCI PO), Take 1,000 mg by mouth 2 (two) times daily., Disp: , Rfl:    buPROPion (WELLBUTRIN XL) 300 MG 24 hr tablet, Take 300 mg by mouth daily., Disp: , Rfl:    Calcium Carbonate-Vitamin D (CALCIUM 600+D PO), Take 1 tablet by mouth 2 (two) times daily., Disp: , Rfl:  calcium gluconate 500 MG tablet, Take 1 tablet by mouth 3 (three) times daily. Pt takes once daily, Disp: , Rfl:    Cyanocobalamin (VITAMIN B-12) 5000 MCG TBDP, Take 1 tablet by mouth daily., Disp: , Rfl:    glucosamine-chondroitin 500-400 MG tablet, Take 1 tablet by mouth 3 (three) times daily. Pt takes 1 tablet once daily, Disp: , Rfl:    hyoscyamine (LEVSIN) 0.125 MG tablet, Take 1 tablet (0.125 mg total) by mouth every 6 (six) hours as needed for cramping (diarrhea,nausea)., Disp: 30 tablet, Rfl: 1   metFORMIN (GLUCOPHAGE-XR) 500 MG 24 hr tablet, Take 500 mg by mouth 2 (two) times daily., Disp: , Rfl:    Multiple Vitamin (MULTIVITAMIN WITH MINERALS) TABS tablet, Take 1 tablet by mouth daily., Disp: , Rfl:    Omega-3 Fatty Acids (FISH OIL) 1000 MG CAPS, Take 1 capsule by mouth daily., Disp: , Rfl:    omeprazole  (PRILOSEC) 20 MG capsule, Take 20 mg by mouth daily., Disp: , Rfl:    rosuvastatin (CRESTOR) 10 MG tablet, Take 10 mg by mouth at bedtime., Disp: , Rfl:    Turmeric (QC TUMERIC COMPLEX) 500 MG CAPS, Take 1 capsule by mouth daily., Disp: , Rfl:   Current Facility-Administered Medications:    0.9 %  sodium chloride infusion, 500 mL, Intravenous, Once, Mansouraty, Netty Starring., MD Allergies  Allergen Reactions   Codeine Nausea Only and Other (See Comments)    "looply"   Other Nausea And Vomiting    general anesthesia    Family History  Problem Relation Age of Onset   Dementia Mother    Alzheimer's disease Mother    Diabetes Mother    Breast cancer Father 53   Prostate cancer Father 44   Melanoma Father 22   Diabetes Father    Breast cancer Sister 88   Pancreatic cancer Sister    Crohn's disease Sister    Irritable bowel syndrome Sister    Diabetes Brother    Heart disease Brother    Stroke Brother 69       vertebral   Breast cancer Paternal Aunt        Unsure of age of onset   Stomach cancer Paternal Aunt    Melanoma Paternal Aunt    Brain cancer Paternal Uncle    Kidney disease Maternal Grandmother    Melanoma Maternal Grandfather    Diabetes Paternal Grandmother    Diabetes Paternal Grandfather    Irritable bowel syndrome Daughter    Arthritis Daughter    Irritable bowel syndrome Daughter    Breast cancer Cousin        3 paternal first cousins, ? onset   Bladder Cancer Cousin        paternal first cousin   Prostate cancer Cousin        paternal first cousin   Diabetes Nephew    Prostate cancer Other 55       metastatic prostate cancer   Breast cancer Other        great niece, ? onset   Ovarian cancer Other    Colon cancer Neg Hx    Esophageal cancer Neg Hx    Rectal cancer Neg Hx    Social History   Socioeconomic History   Marital status: Married    Spouse name: Not on file   Number of children: 2   Years of education: Not on file   Highest education  level: Not on file  Occupational History   Occupation: retired  Tobacco Use   Smoking status: Never   Smokeless tobacco: Never  Vaping Use   Vaping Use: Never used  Substance and Sexual Activity   Alcohol use: No   Drug use: No   Sexual activity: Not on file  Other Topics Concern   Not on file  Social History Narrative   Lives with husband.  Two children and three grands.     Social Determinants of Health   Financial Resource Strain: Not on file  Food Insecurity: Not on file  Transportation Needs: Not on file  Physical Activity: Not on file  Stress: Not on file  Social Connections: Not on file  Intimate Partner Violence: Not on file    Physical Exam: Today's Vitals   02/04/23 0832  BP: 107/64  Pulse: 70  Temp: (!) 97.3 F (36.3 C)  TempSrc: Temporal  SpO2: 98%  Weight: 152 lb (68.9 kg)  Height: 5' 7.5" (1.715 m)   Body mass index is 23.46 kg/m. GEN: NAD EYE: Sclerae anicteric ENT: MMM CV: Non-tachycardic GI: Soft, NT/ND NEURO:  Alert & Oriented x 3  Lab Results: No results for input(s): "WBC", "HGB", "HCT", "PLT" in the last 72 hours. BMET No results for input(s): "NA", "K", "CL", "CO2", "GLUCOSE", "BUN", "CREATININE", "CALCIUM" in the last 72 hours. LFT No results for input(s): "PROT", "ALBUMIN", "AST", "ALT", "ALKPHOS", "BILITOT", "BILIDIR", "IBILI" in the last 72 hours. PT/INR No results for input(s): "LABPROT", "INR" in the last 72 hours.   Impression / Plan: This is a 72 y.o.female who presents for Colonoscopy for screening with episodes of RLQ pain at times.  The risks and benefits of endoscopic evaluation/treatment were discussed with the patient and/or family; these include but are not limited to the risk of perforation, infection, bleeding, missed lesions, lack of diagnosis, severe illness requiring hospitalization, as well as anesthesia and sedation related illnesses.  The patient's history has been reviewed, patient examined, no change in  status, and deemed stable for procedure.  The patient and/or family is agreeable to proceed.    Corliss Parish, MD [facility] Office # [phone]

## 2023-02-04 NOTE — Progress Notes (Signed)
Uneventful anesthetic. Report to pacu rn. Vss. Care resumed by rn. 

## 2023-02-04 NOTE — Op Note (Signed)
[facility] Patient Name: Carol Bell Procedure Date: 02/04/2023 8:46 AM MRN: [id number] Endoscopist: Corliss Parish , MD, [id number] Age: 72 Referring MD:  Date of Birth: 25-Feb-1951 Gender: Female Account #: [id number] Procedure:                Colonoscopy Indications:              Screening for colorectal malignant neoplasm,                            Incidental abdominal pain noted Medicines:                Monitored Anesthesia Care Procedure:                Pre-Anesthesia Assessment:                           - Prior to the procedure, a History and Physical                            was performed, and patient medications and                            allergies were reviewed. The patient's tolerance of                            previous anesthesia was also reviewed. The risks                            and benefits of the procedure and the sedation                            options and risks were discussed with the patient.                            All questions were answered, and informed consent                            was obtained. Prior Anticoagulants: The patient has                            taken no anticoagulant or antiplatelet agents. ASA                            Grade Assessment: II - A patient with mild systemic                            disease. After reviewing the risks and benefits,                            the patient was deemed in satisfactory condition to                            undergo the procedure.  After obtaining informed consent, the colonoscope                            was passed under direct vision. Throughout the                            procedure, the patient's blood pressure, pulse, and                            oxygen saturations were monitored continuously. The                            Olympus CF-HQ190L [id number]) Colonoscope was                            introduced through the anus and  advanced to the 3                            cm into the ileum. The colonoscopy was performed                            without difficulty. The patient tolerated the                            procedure. The quality of the bowel preparation was                            adequate. The terminal ileum, ileocecal valve,                            appendiceal orifice, and rectum were photographed. Scope In: 9:05:35 AM Scope Out: 9:28:44 AM Scope Withdrawal Time: 0 hours 16 minutes 51 seconds  Total Procedure Duration: 0 hours 23 minutes 9 seconds  Findings:                 The digital rectal exam findings include                            hemorrhoids. Pertinent negatives include no                            palpable rectal lesions.                           The colon (entire examined portion) was                            significantly tortuous with some looping that ws                            necessary.                           The terminal ileum and ileocecal valve appeared  normal.                           Four sessile polyps were found in the descending                            colon (1), transverse colon (1) (2) and ascending                            colon. The polyps were 2 to 6 mm in size. These                            polyps were removed with a cold snare. Resection                            and retrieval were complete.                           Normal mucosa was found in the entire colon                            otherwise.                           Non-bleeding non-thrombosed external and internal                            hemorrhoids were found during retroflexion, during                            perianal exam and during digital exam. The                            hemorrhoids were Grade II (internal hemorrhoids                            that prolapse but reduce spontaneously). Complications:            No immediate  complications. Estimated Blood Loss:     Estimated blood loss was minimal. Impression:               - Hemorrhoids found on digital rectal exam.                           - Tortuous colon. Looping was necessary for the                            procedure to be completed successfully.                           - The examined portion of the ileum was normal.                           - Four 2 to 6 mm polyps in the descending colon, in  the transverse colon and in the ascending colon,                            removed with a cold snare. Resected and retrieved.                           - Normal mucosa in the entire examined colon                            otherwise.on-bleeding non-thrombosed external and                            internal hemorrhoids. Recommendation:           - The patient will be observed post-procedure,                            until all discharge criteria are met.                           - Discharge patient to home.                           - Patient has a contact number available for                            emergencies. The signs and symptoms of potential                            delayed complications were discussed with the                            patient. Return to normal activities tomorrow.                            Written discharge instructions were provided to the                            patient.                           - High fiber diet.                           - Use FiberCon 1-2 tablets PO daily.                           - Continue present medications.                           - Await pathology results.                           - Repeat colonoscopy in 3/5/7 years for                            surveillance based  on pathology results.                           - The findings and recommendations were discussed                            with the patient.                           - The findings and  recommendations were discussed                            with the patient's family. Corliss Parish, MD 02/04/2023 9:35:49 AM

## 2023-02-05 ENCOUNTER — Telehealth: Payer: Self-pay

## 2023-02-05 NOTE — Telephone Encounter (Signed)
  Follow up Call-     02/04/2023    8:33 AM  Call back number  Post procedure Call Back phone  # [phone]  Permission to leave phone message Yes     Patient questions:  Do you have a fever, pain , or abdominal swelling? No. Pain Score  0 *  Have you tolerated food without any problems? Yes.    Have you been able to return to your normal activities? Yes.    Do you have any questions about your discharge instructions: Diet   No. Medications  No. Follow up visit  No.  Do you have questions or concerns about your Care? No.  Actions: * If pain score is 4 or above: No action needed, pain <4.

## 2023-02-06 ENCOUNTER — Encounter: Payer: Self-pay | Admitting: Gastroenterology

## 2023-02-19 DIAGNOSIS — R1030 Lower abdominal pain, unspecified: Secondary | ICD-10-CM | POA: Diagnosis not present

## 2023-02-19 DIAGNOSIS — E782 Mixed hyperlipidemia: Secondary | ICD-10-CM | POA: Diagnosis not present

## 2023-02-19 DIAGNOSIS — E1169 Type 2 diabetes mellitus with other specified complication: Secondary | ICD-10-CM | POA: Diagnosis not present

## 2023-02-19 DIAGNOSIS — I700 Atherosclerosis of aorta: Secondary | ICD-10-CM | POA: Diagnosis not present

## 2023-02-19 DIAGNOSIS — Z86010 Personal history of colonic polyps: Secondary | ICD-10-CM | POA: Diagnosis not present

## 2023-02-19 DIAGNOSIS — I493 Ventricular premature depolarization: Secondary | ICD-10-CM | POA: Diagnosis not present

## 2023-02-19 DIAGNOSIS — K219 Gastro-esophageal reflux disease without esophagitis: Secondary | ICD-10-CM | POA: Diagnosis not present

## 2023-03-10 DIAGNOSIS — G43109 Migraine with aura, not intractable, without status migrainosus: Secondary | ICD-10-CM | POA: Diagnosis not present

## 2023-03-10 DIAGNOSIS — H0102A Squamous blepharitis right eye, upper and lower eyelids: Secondary | ICD-10-CM | POA: Diagnosis not present

## 2023-03-10 DIAGNOSIS — H0102B Squamous blepharitis left eye, upper and lower eyelids: Secondary | ICD-10-CM | POA: Diagnosis not present

## 2023-03-10 DIAGNOSIS — E119 Type 2 diabetes mellitus without complications: Secondary | ICD-10-CM | POA: Diagnosis not present

## 2023-03-10 DIAGNOSIS — H43811 Vitreous degeneration, right eye: Secondary | ICD-10-CM | POA: Diagnosis not present

## 2023-03-10 DIAGNOSIS — H2513 Age-related nuclear cataract, bilateral: Secondary | ICD-10-CM | POA: Diagnosis not present

## 2023-04-21 DIAGNOSIS — M25561 Pain in right knee: Secondary | ICD-10-CM | POA: Diagnosis not present

## 2023-05-12 ENCOUNTER — Other Ambulatory Visit (HOSPITAL_COMMUNITY): Payer: Self-pay | Admitting: Orthopedic Surgery

## 2023-05-12 DIAGNOSIS — T84032A Mechanical loosening of internal right knee prosthetic joint, initial encounter: Secondary | ICD-10-CM

## 2023-05-12 DIAGNOSIS — Z96651 Presence of right artificial knee joint: Secondary | ICD-10-CM | POA: Diagnosis not present

## 2023-05-12 DIAGNOSIS — M25561 Pain in right knee: Secondary | ICD-10-CM | POA: Diagnosis not present

## 2023-05-19 ENCOUNTER — Encounter (HOSPITAL_COMMUNITY)
Admission: RE | Admit: 2023-05-19 | Discharge: 2023-05-19 | Disposition: A | Discharge status: Home / Self Care | Payer: PPO | Source: Ambulatory Visit | Attending: Orthopedic Surgery | Admitting: Orthopedic Surgery

## 2023-05-19 DIAGNOSIS — T84032A Mechanical loosening of internal right knee prosthetic joint, initial encounter: Secondary | ICD-10-CM | POA: Insufficient documentation

## 2023-05-19 DIAGNOSIS — M25361 Other instability, right knee: Secondary | ICD-10-CM | POA: Diagnosis not present

## 2023-05-19 DIAGNOSIS — Z96651 Presence of right artificial knee joint: Secondary | ICD-10-CM | POA: Diagnosis not present

## 2023-05-19 MED ORDER — TECHNETIUM TC 99M MEDRONATE IV KIT
20.0000 | PACK | Freq: Once | INTRAVENOUS | Status: AC | PRN
  Administered 2023-05-19: 21.4 via INTRAVENOUS

## 2023-06-11 DIAGNOSIS — M25561 Pain in right knee: Secondary | ICD-10-CM | POA: Diagnosis not present

## 2023-06-17 DIAGNOSIS — M7631 Iliotibial band syndrome, right leg: Secondary | ICD-10-CM | POA: Diagnosis not present

## 2023-06-23 DIAGNOSIS — L989 Disorder of the skin and subcutaneous tissue, unspecified: Secondary | ICD-10-CM | POA: Diagnosis not present

## 2023-06-23 DIAGNOSIS — E782 Mixed hyperlipidemia: Secondary | ICD-10-CM | POA: Diagnosis not present

## 2023-06-23 DIAGNOSIS — E1169 Type 2 diabetes mellitus with other specified complication: Secondary | ICD-10-CM | POA: Diagnosis not present

## 2023-06-23 DIAGNOSIS — F418 Other specified anxiety disorders: Secondary | ICD-10-CM | POA: Diagnosis not present

## 2023-07-01 DIAGNOSIS — L821 Other seborrheic keratosis: Secondary | ICD-10-CM | POA: Diagnosis not present

## 2023-07-01 DIAGNOSIS — D1801 Hemangioma of skin and subcutaneous tissue: Secondary | ICD-10-CM | POA: Diagnosis not present

## 2023-07-01 DIAGNOSIS — D485 Neoplasm of uncertain behavior of skin: Secondary | ICD-10-CM | POA: Diagnosis not present

## 2023-07-01 DIAGNOSIS — L814 Other melanin hyperpigmentation: Secondary | ICD-10-CM | POA: Diagnosis not present

## 2023-07-01 DIAGNOSIS — L570 Actinic keratosis: Secondary | ICD-10-CM | POA: Diagnosis not present

## 2023-07-01 DIAGNOSIS — L820 Inflamed seborrheic keratosis: Secondary | ICD-10-CM | POA: Diagnosis not present

## 2023-07-04 DIAGNOSIS — Z23 Encounter for immunization: Secondary | ICD-10-CM | POA: Diagnosis not present

## 2023-08-28 IMAGING — MG MM DIGITAL SCREENING BILAT W/ TOMO AND CAD
6 of 10 series · 6 of 30 positions shown · non-contrast
Comparison: Previous exam(s).

CLINICAL DATA: Screening.

EXAM:
DIGITAL SCREENING BILATERAL MAMMOGRAM WITH TOMOSYNTHESIS AND CAD
TECHNIQUE: Bilateral screening digital craniocaudal and mediolateral oblique
mammograms were obtained. Bilateral screening digital breast
tomosynthesis was performed. The images were evaluated with
computer-aided detection.

[R CC synth-2D]
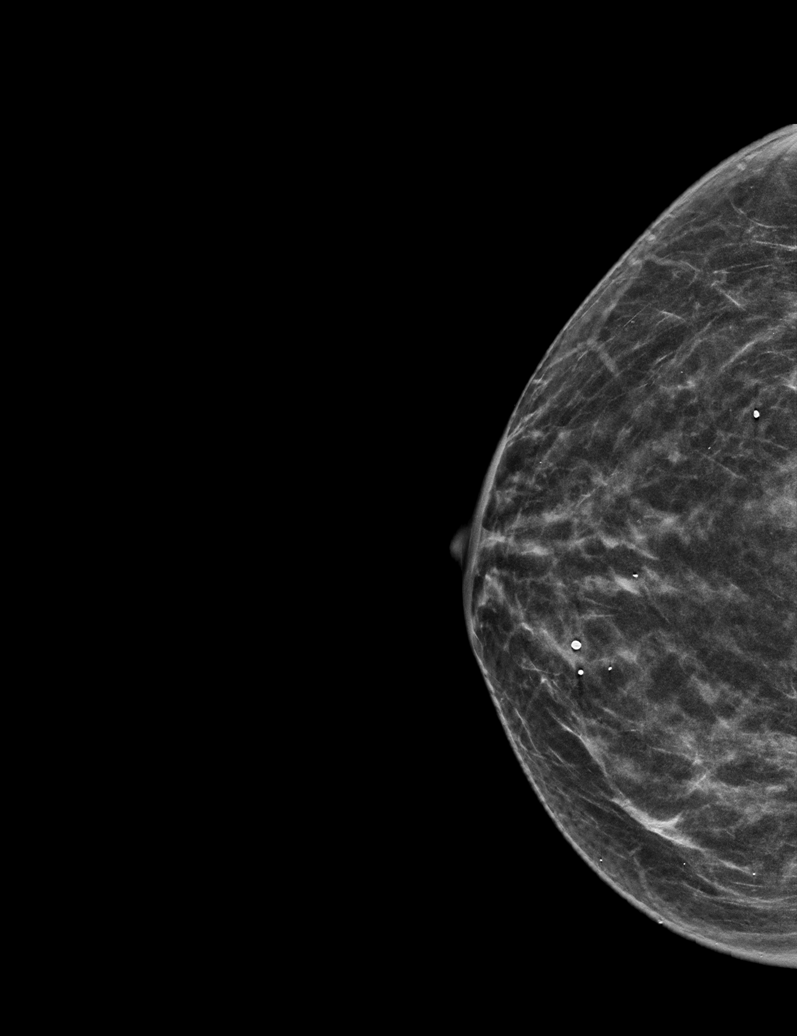

[R MLO synth-2D (1 of 2)]
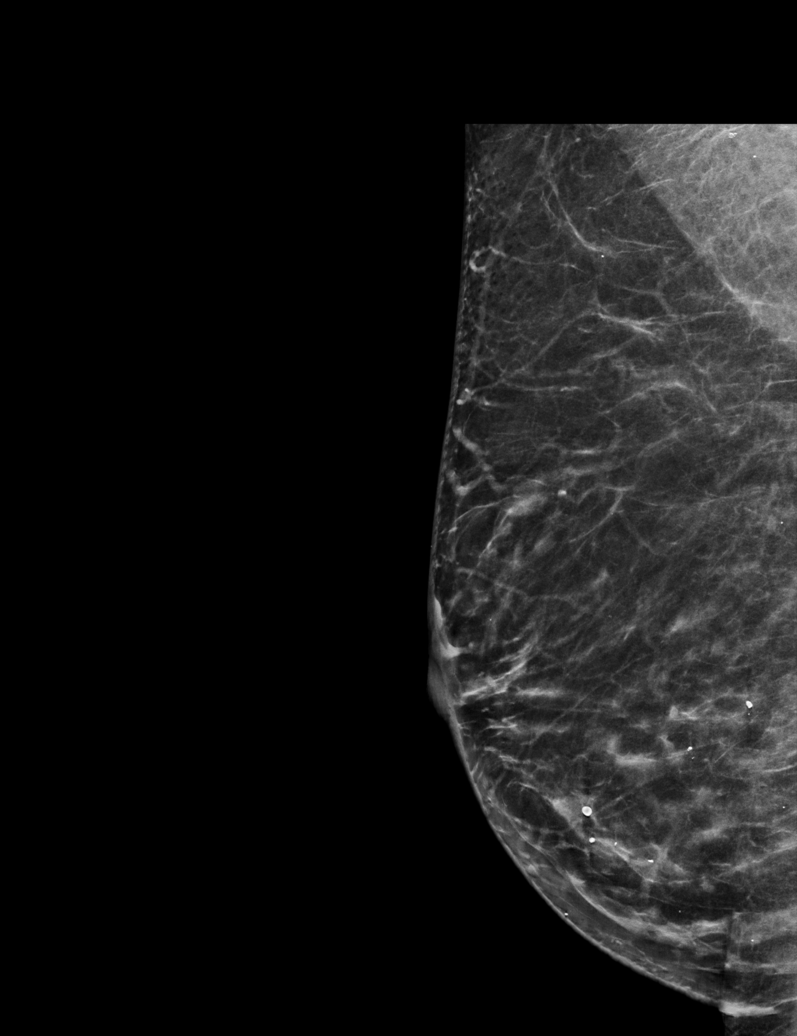

[R MLO synth-2D (2 of 2)]
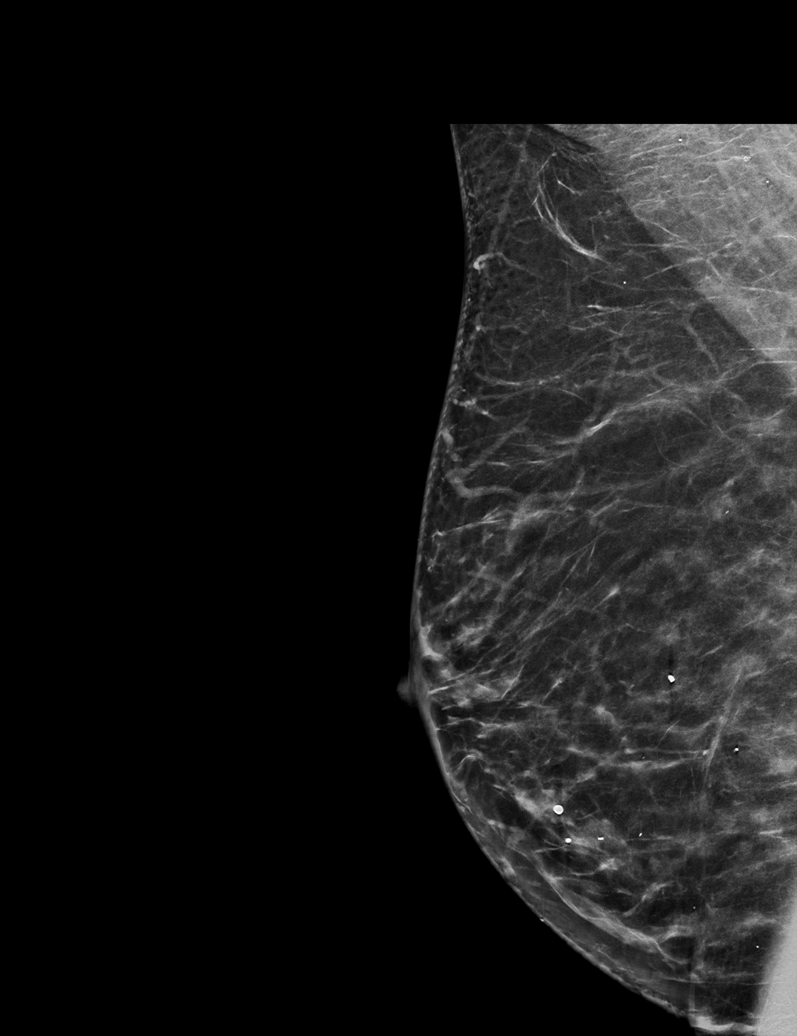

[L MLO synth-2D]
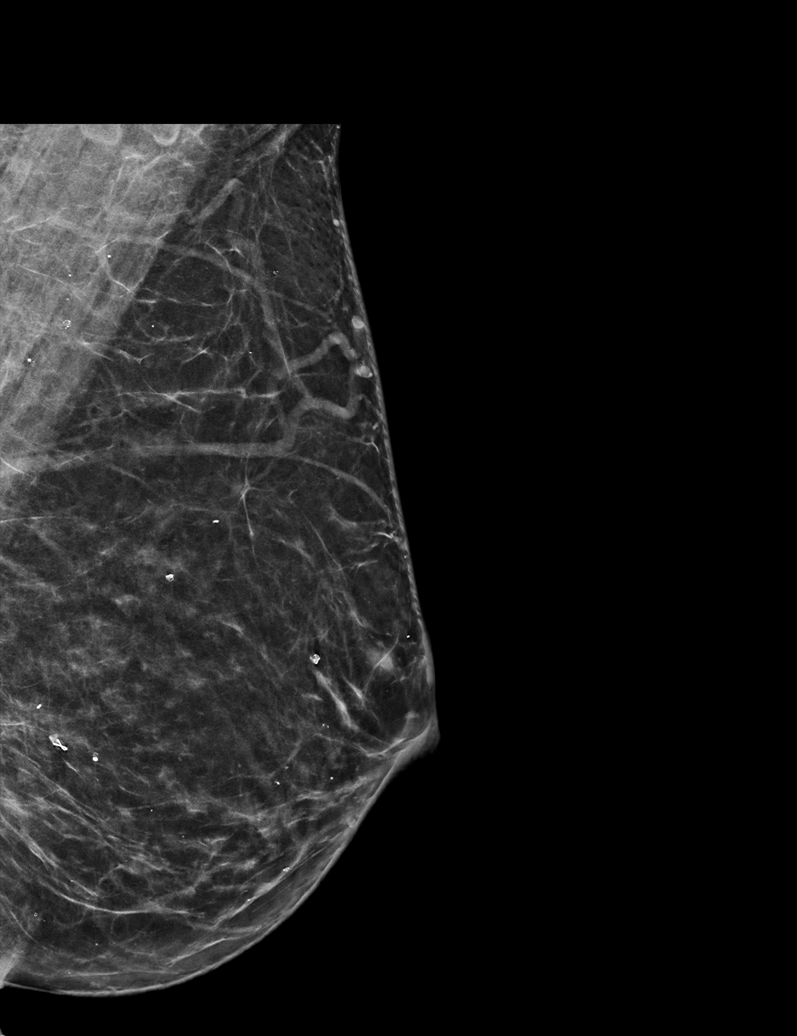

[L CC synth-2D]
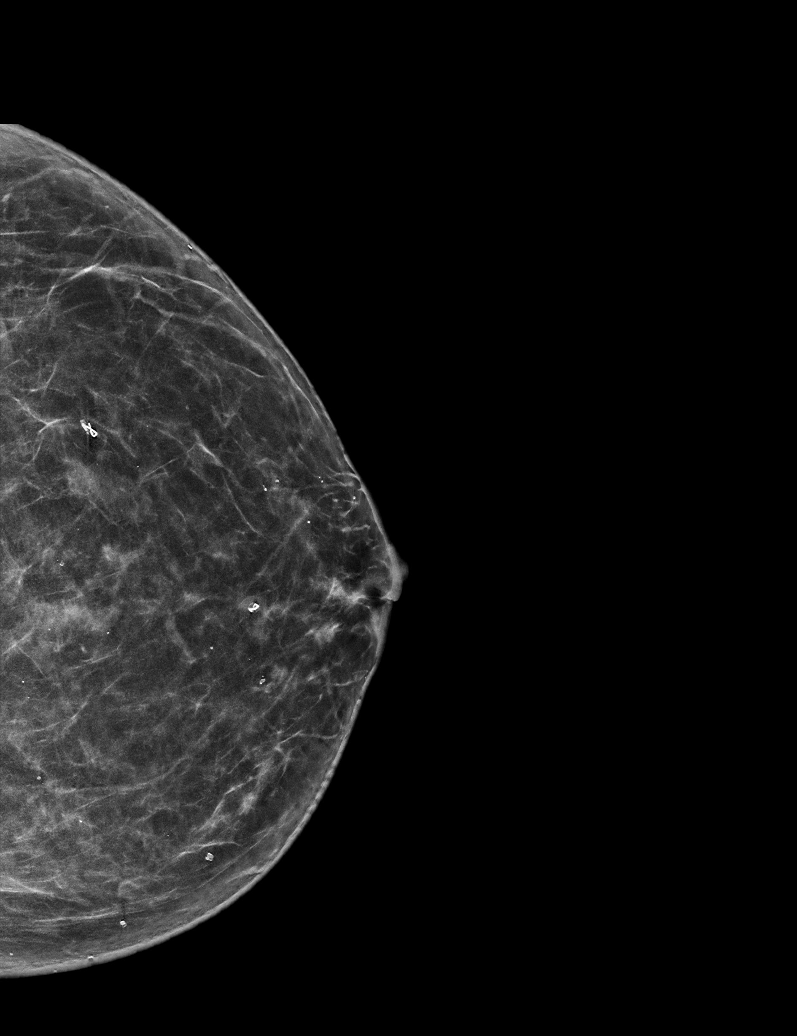

[R MLO tomo · tomo slice 32/63.0]
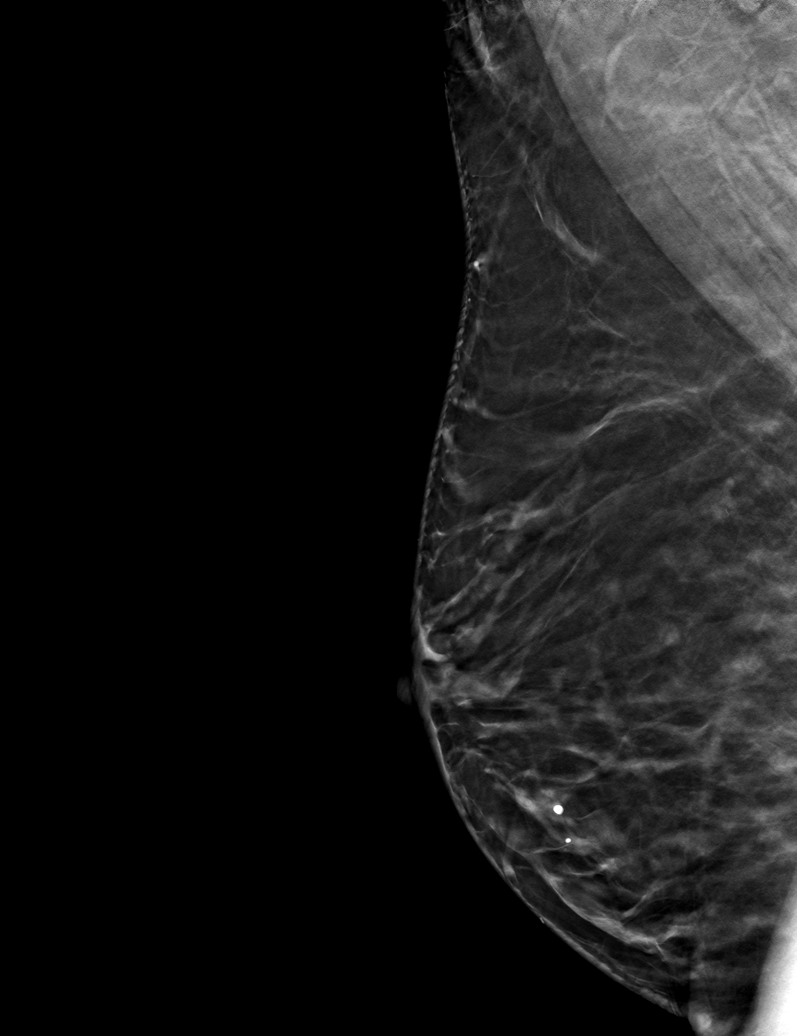

[6 of 30 positions shown; findings below may reference images not displayed]

ACR Breast Density Category b: There are scattered areas of
fibroglandular density.
FINDINGS: There are no findings suspicious for malignancy.
IMPRESSION: No mammographic evidence of malignancy. A result letter of this
screening mammogram will be mailed directly to the patient.

RECOMMENDATION:
Screening mammogram in one year. (Code:51-O-LD2)

BI-RADS CATEGORY  1: Negative.

## 2023-09-02 IMAGING — CT CT ABD-PELV W/O CM
2 of 4 series · 12 of 46 positions shown, 14 images · non-contrast
Comparison: 02/24/2017 CT abdomen/pelvis.

CLINICAL DATA: Right flank and abdominal pain for 1 week. History
of nephrolithiasis and hysterectomy.



[Series 2: routine abdomen pelvis without 5.00 br40 s3 axial · axial · non-contrast · 0.56mm/px · z∈[+1127,+1527]mm · 9 of 96 slices shown, 11 images]
[im 8/96  soft-tissue]
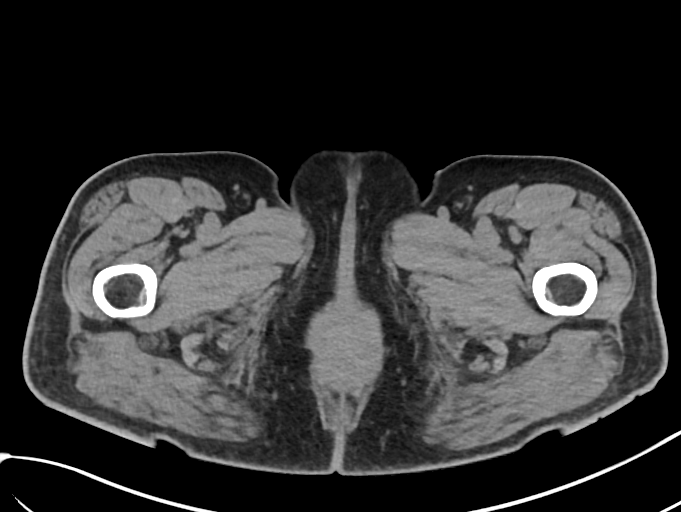
[im 8/96  bone]
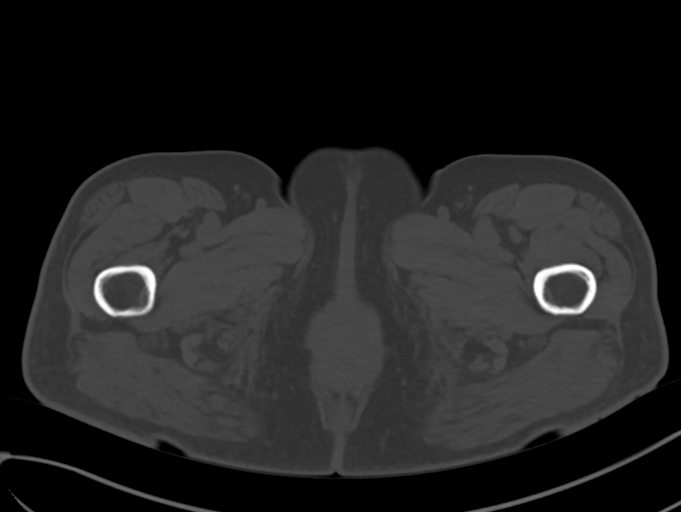
[im 16/96  soft-tissue]
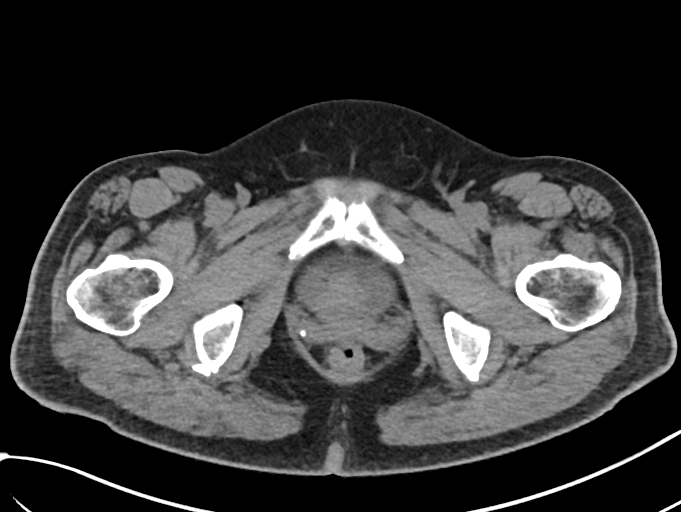
[im 28/96  soft-tissue]
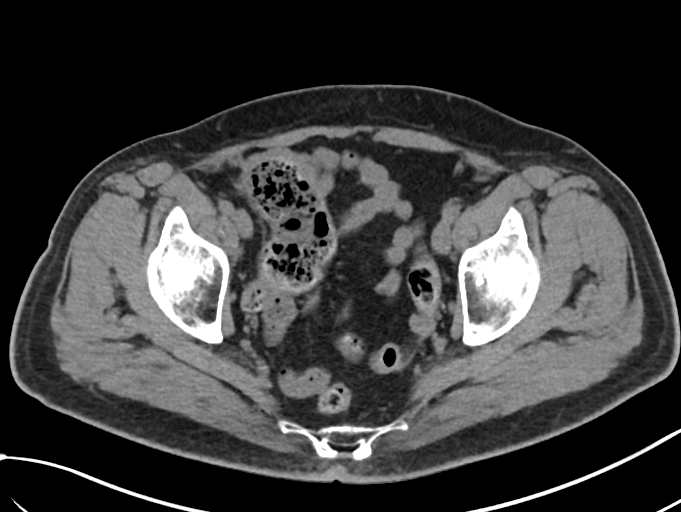
[im 36/96  soft-tissue]
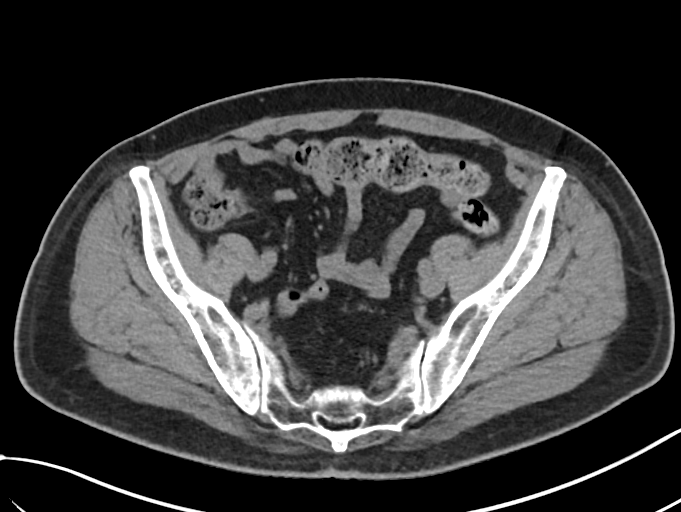
[im 48/96  soft-tissue]
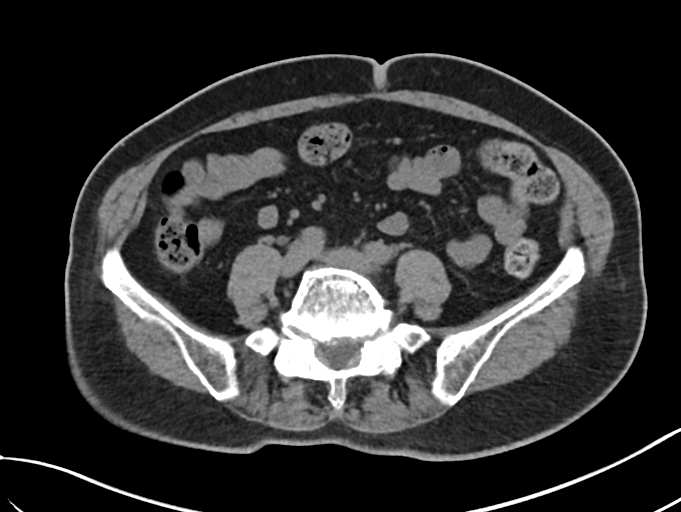
[im 60/96  soft-tissue]
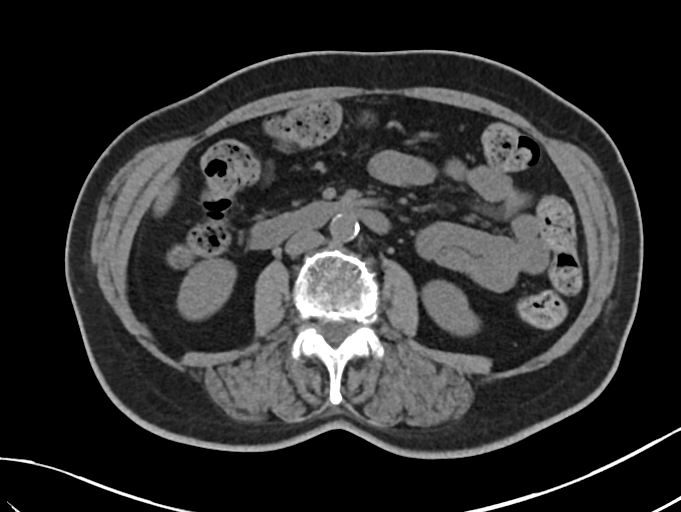
[im 68/96  soft-tissue]
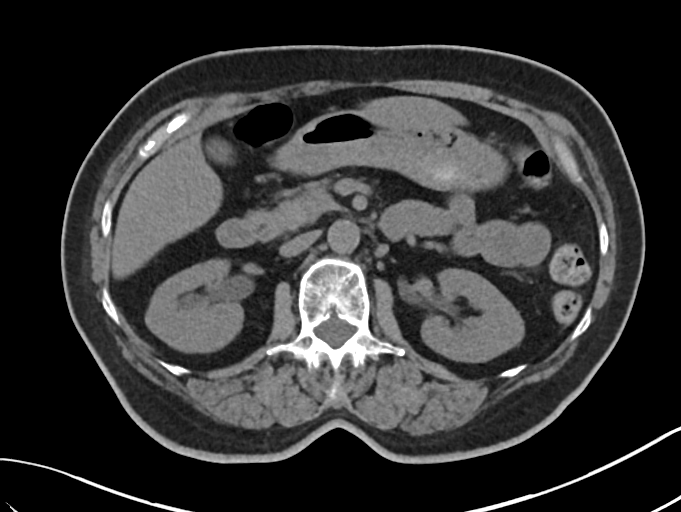
[im 80/96  soft-tissue]
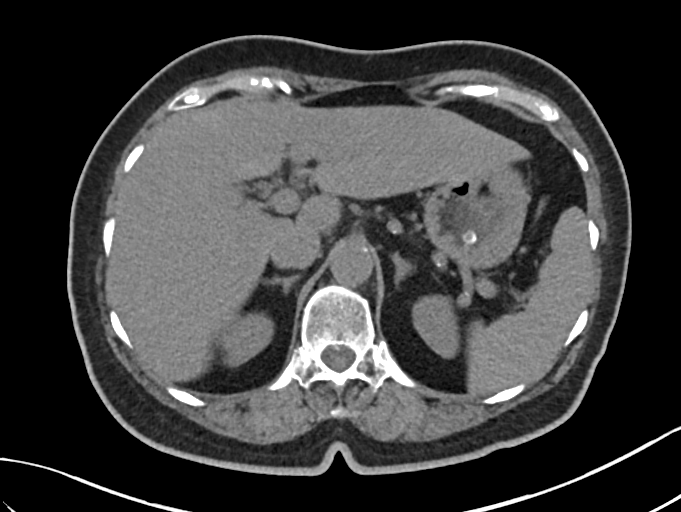
[im 88/96  soft-tissue]
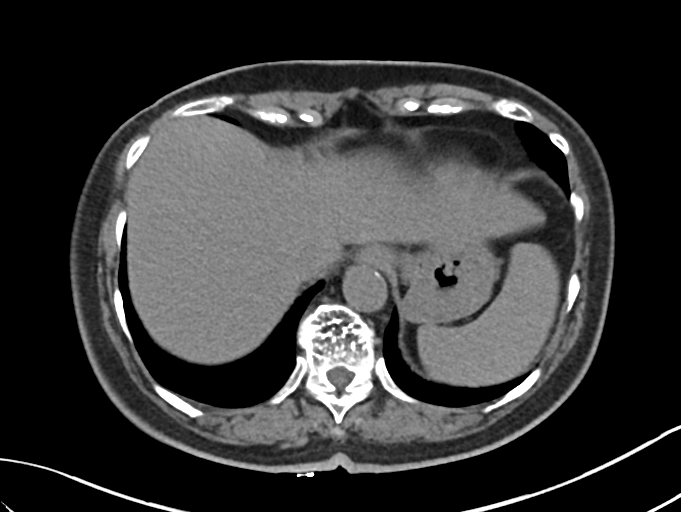
[im 88/96  bone]
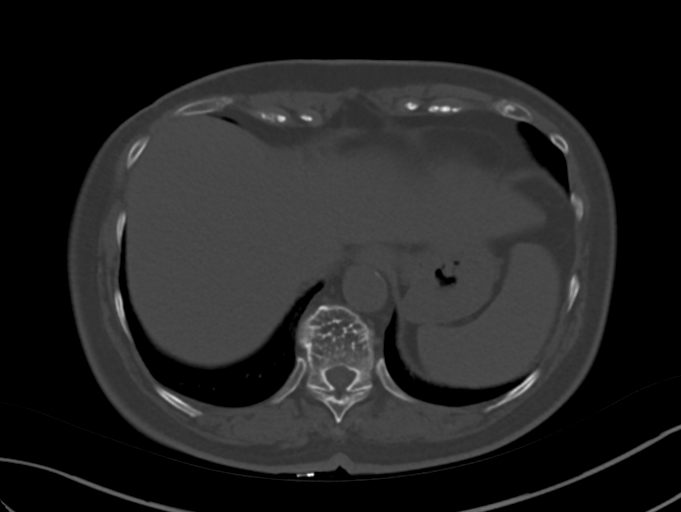

[Series 4: routine abdomen pelvis without 2.00 br40 s3 cor · coronal · non-contrast · 0.75mm/px · 3 of 144 slices shown]
[im 48/144  soft-tissue]
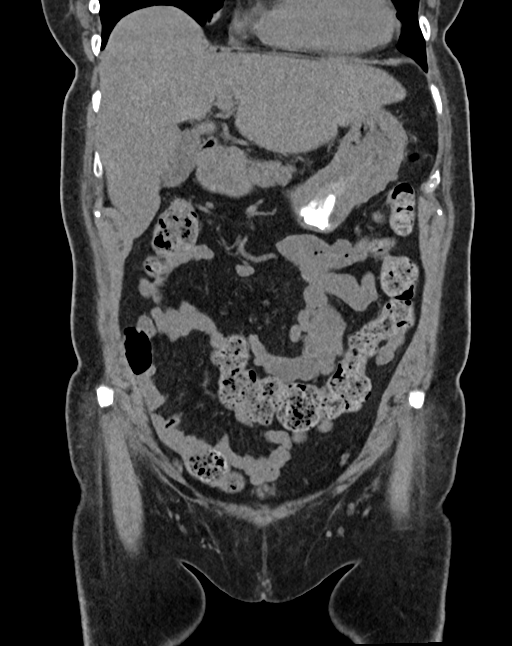
[im 64/144  soft-tissue]
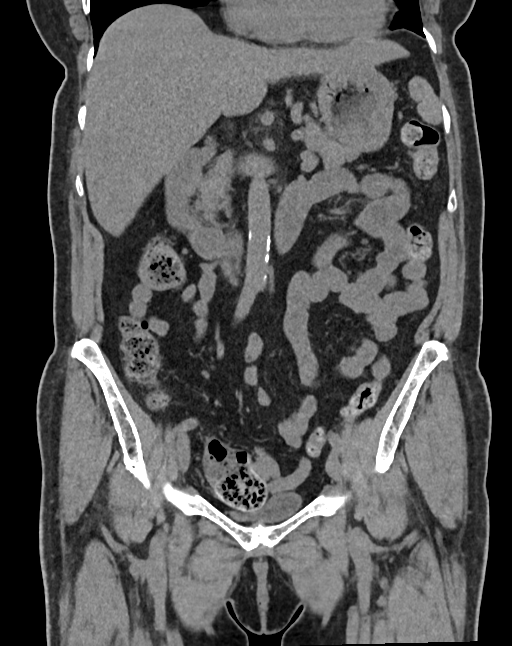
[im 80/144  soft-tissue]
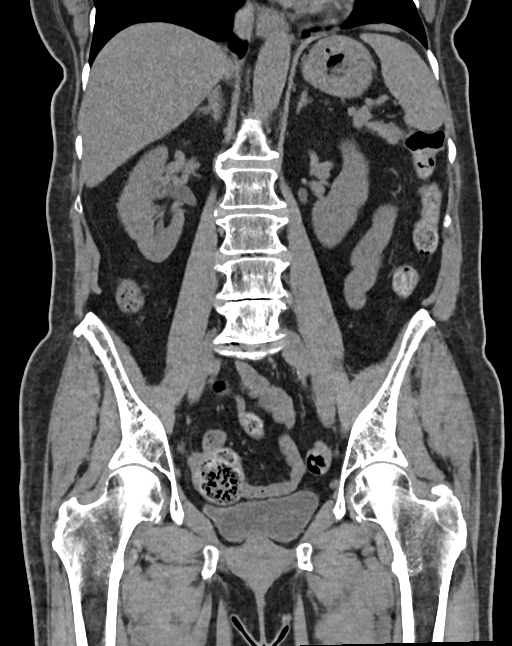

[12 of 46 positions shown; findings below may reference images not displayed]

FINDINGS: Lower chest: No acute abnormality at the lung bases.

Hepatobiliary: Normal liver size. No liver mass. Normal gallbladder
with no radiopaque cholelithiasis. No biliary ductal dilatation.

Pancreas: Normal, with no mass or duct dilation.

Spleen: Normal size. No mass.

Adrenals/Urinary Tract: Normal adrenals. No hydronephrosis. No right
renal stones. Nonobstructing 2 mm lower left renal stone. Simple
cm posterior interpolar right renal cyst, for which no follow-up is
recommended. No additional contour deforming renal lesions. Normal
caliber ureters. No ureteral stones. Normal bladder. A punctate 1 mm
calcification in the region of the urethra (series 2/image 83) could
represent a tiny urethral stone.

Stomach/Bowel: Normal non-distended stomach. Normal caliber small
bowel with no small bowel wall thickening. Normal appendix. Normal
large bowel with no diverticulosis, large bowel wall thickening or
pericolonic fat stranding.

Vascular/Lymphatic: Atherosclerotic nonaneurysmal abdominal aorta.
No pathologically enlarged lymph nodes in the abdomen or pelvis.

Reproductive: Status post hysterectomy, with no abnormal findings at
the vaginal cuff. No adnexal mass.

Other: No pneumoperitoneum, ascites or focal fluid collection.

Musculoskeletal: No aggressive appearing focal osseous lesions.
Marked thoracolumbar spondylosis.
IMPRESSION: 1. Punctate 1 mm calcification in the region of the urethra could
represent a tiny urethral stone. No hydronephrosis. No ureteral or
bladder stones.
2. Nonobstructing 2 mm lower left renal stone.
3. Aortic Atherosclerosis ([id number]).

## 2023-11-18 DIAGNOSIS — M810 Age-related osteoporosis without current pathological fracture: Secondary | ICD-10-CM | POA: Diagnosis not present

## 2023-11-18 DIAGNOSIS — E1169 Type 2 diabetes mellitus with other specified complication: Secondary | ICD-10-CM | POA: Diagnosis not present

## 2023-11-18 DIAGNOSIS — E782 Mixed hyperlipidemia: Secondary | ICD-10-CM | POA: Diagnosis not present

## 2023-11-25 DIAGNOSIS — Z860100 Personal history of colon polyps, unspecified: Secondary | ICD-10-CM | POA: Diagnosis not present

## 2023-11-25 DIAGNOSIS — Z1339 Encounter for screening examination for other mental health and behavioral disorders: Secondary | ICD-10-CM | POA: Diagnosis not present

## 2023-11-25 DIAGNOSIS — F418 Other specified anxiety disorders: Secondary | ICD-10-CM | POA: Diagnosis not present

## 2023-11-25 DIAGNOSIS — E782 Mixed hyperlipidemia: Secondary | ICD-10-CM | POA: Diagnosis not present

## 2023-11-25 DIAGNOSIS — M8580 Other specified disorders of bone density and structure, unspecified site: Secondary | ICD-10-CM | POA: Diagnosis not present

## 2023-11-25 DIAGNOSIS — Z0000 Encounter for general adult medical examination without abnormal findings: Secondary | ICD-10-CM | POA: Diagnosis not present

## 2023-11-25 DIAGNOSIS — I700 Atherosclerosis of aorta: Secondary | ICD-10-CM | POA: Diagnosis not present

## 2023-11-25 DIAGNOSIS — Z1331 Encounter for screening for depression: Secondary | ICD-10-CM | POA: Diagnosis not present

## 2023-11-25 DIAGNOSIS — E1169 Type 2 diabetes mellitus with other specified complication: Secondary | ICD-10-CM | POA: Diagnosis not present

## 2023-11-25 DIAGNOSIS — K219 Gastro-esophageal reflux disease without esophagitis: Secondary | ICD-10-CM | POA: Diagnosis not present

## 2023-11-26 ENCOUNTER — Other Ambulatory Visit: Payer: Self-pay | Admitting: Internal Medicine

## 2023-11-26 DIAGNOSIS — Z1231 Encounter for screening mammogram for malignant neoplasm of breast: Secondary | ICD-10-CM

## 2023-12-30 ENCOUNTER — Ambulatory Visit
Admission: RE | Admit: 2023-12-30 | Discharge: 2023-12-30 | Disposition: A | Discharge status: Home / Self Care | Payer: PPO | Source: Ambulatory Visit | Attending: Internal Medicine | Admitting: Internal Medicine

## 2023-12-30 DIAGNOSIS — Z1231 Encounter for screening mammogram for malignant neoplasm of breast: Secondary | ICD-10-CM

## 2024-03-03 DIAGNOSIS — E782 Mixed hyperlipidemia: Secondary | ICD-10-CM | POA: Diagnosis not present

## 2024-03-03 DIAGNOSIS — K219 Gastro-esophageal reflux disease without esophagitis: Secondary | ICD-10-CM | POA: Diagnosis not present

## 2024-03-03 DIAGNOSIS — E1169 Type 2 diabetes mellitus with other specified complication: Secondary | ICD-10-CM | POA: Diagnosis not present

## 2024-03-03 DIAGNOSIS — I493 Ventricular premature depolarization: Secondary | ICD-10-CM | POA: Diagnosis not present

## 2024-03-03 DIAGNOSIS — M8580 Other specified disorders of bone density and structure, unspecified site: Secondary | ICD-10-CM | POA: Diagnosis not present

## 2024-04-06 DIAGNOSIS — R3129 Other microscopic hematuria: Secondary | ICD-10-CM | POA: Diagnosis not present

## 2024-04-06 DIAGNOSIS — R3915 Urgency of urination: Secondary | ICD-10-CM | POA: Diagnosis not present

## 2024-04-06 DIAGNOSIS — N390 Urinary tract infection, site not specified: Secondary | ICD-10-CM | POA: Diagnosis not present

## 2024-04-06 DIAGNOSIS — Z87442 Personal history of urinary calculi: Secondary | ICD-10-CM | POA: Diagnosis not present

## 2024-04-11 DIAGNOSIS — M8589 Other specified disorders of bone density and structure, multiple sites: Secondary | ICD-10-CM | POA: Diagnosis not present

## 2024-05-05 DIAGNOSIS — M8580 Other specified disorders of bone density and structure, unspecified site: Secondary | ICD-10-CM | POA: Diagnosis not present

## 2024-05-05 DIAGNOSIS — K219 Gastro-esophageal reflux disease without esophagitis: Secondary | ICD-10-CM | POA: Diagnosis not present

## 2024-05-09 ENCOUNTER — Telehealth (HOSPITAL_COMMUNITY): Payer: Self-pay

## 2024-05-09 DIAGNOSIS — H43811 Vitreous degeneration, right eye: Secondary | ICD-10-CM | POA: Diagnosis not present

## 2024-05-09 DIAGNOSIS — H0102B Squamous blepharitis left eye, upper and lower eyelids: Secondary | ICD-10-CM | POA: Diagnosis not present

## 2024-05-09 DIAGNOSIS — H2513 Age-related nuclear cataract, bilateral: Secondary | ICD-10-CM | POA: Diagnosis not present

## 2024-05-09 DIAGNOSIS — H0102A Squamous blepharitis right eye, upper and lower eyelids: Secondary | ICD-10-CM | POA: Diagnosis not present

## 2024-05-09 DIAGNOSIS — E119 Type 2 diabetes mellitus without complications: Secondary | ICD-10-CM | POA: Diagnosis not present

## 2024-05-09 NOTE — Telephone Encounter (Signed)
 Auth Submission: NO AUTH NEEDED Site of care: Site of care: [facility] Payer: [employer] Medication & CPT/J Code(s) submitted: Reclast  (Zolendronic acid) J3489 Diagnosis Code: M81.0 Route of submission (phone, fax, portal):  Phone # Fax # Auth type: Buy/Bill HB Units/visits requested: 5mg  x 1 dose Reference number:  Approval from: 05/09/24 to 09/28/24

## 2024-05-16 ENCOUNTER — Other Ambulatory Visit (HOSPITAL_COMMUNITY): Payer: Self-pay

## 2024-05-17 ENCOUNTER — Ambulatory Visit (HOSPITAL_COMMUNITY)
Admission: RE | Admit: 2024-05-17 | Discharge: 2024-05-17 | Disposition: A | Discharge status: Home / Self Care | Source: Ambulatory Visit | Attending: Internal Medicine | Admitting: Internal Medicine

## 2024-05-17 DIAGNOSIS — M810 Age-related osteoporosis without current pathological fracture: Secondary | ICD-10-CM | POA: Diagnosis not present

## 2024-05-17 MED ORDER — ZOLEDRONIC ACID 5 MG/100ML IV SOLN
5.0000 mg | Freq: Once | INTRAVENOUS | Status: AC
  Administered 2024-05-17: 5 mg via INTRAVENOUS

## 2024-05-17 MED ORDER — ZOLEDRONIC ACID 5 MG/100ML IV SOLN
INTRAVENOUS | Status: AC
  Filled 2024-05-17: qty 100

## 2024-05-26 DIAGNOSIS — H2511 Age-related nuclear cataract, right eye: Secondary | ICD-10-CM | POA: Diagnosis not present

## 2024-06-09 DIAGNOSIS — H2512 Age-related nuclear cataract, left eye: Secondary | ICD-10-CM | POA: Diagnosis not present

## 2024-06-28 DIAGNOSIS — Z23 Encounter for immunization: Secondary | ICD-10-CM | POA: Diagnosis not present

## 2024-07-07 DIAGNOSIS — E1169 Type 2 diabetes mellitus with other specified complication: Secondary | ICD-10-CM | POA: Diagnosis not present

## 2024-07-07 DIAGNOSIS — K219 Gastro-esophageal reflux disease without esophagitis: Secondary | ICD-10-CM | POA: Diagnosis not present

## 2024-07-07 DIAGNOSIS — E782 Mixed hyperlipidemia: Secondary | ICD-10-CM | POA: Diagnosis not present

## 2024-07-07 DIAGNOSIS — M8580 Other specified disorders of bone density and structure, unspecified site: Secondary | ICD-10-CM | POA: Diagnosis not present

## 2024-07-07 DIAGNOSIS — K5900 Constipation, unspecified: Secondary | ICD-10-CM | POA: Diagnosis not present

## 2024-07-07 DIAGNOSIS — Z7189 Other specified counseling: Secondary | ICD-10-CM | POA: Diagnosis not present

## 2024-08-04 DIAGNOSIS — R0981 Nasal congestion: Secondary | ICD-10-CM | POA: Diagnosis not present

## 2024-08-04 DIAGNOSIS — Z1152 Encounter for screening for COVID-19: Secondary | ICD-10-CM | POA: Diagnosis not present

## 2024-08-04 DIAGNOSIS — R5383 Other fatigue: Secondary | ICD-10-CM | POA: Diagnosis not present

## 2024-08-04 DIAGNOSIS — J019 Acute sinusitis, unspecified: Secondary | ICD-10-CM | POA: Diagnosis not present

## 2024-08-04 DIAGNOSIS — R059 Cough, unspecified: Secondary | ICD-10-CM | POA: Diagnosis not present

## 2024-08-04 DIAGNOSIS — I493 Ventricular premature depolarization: Secondary | ICD-10-CM | POA: Diagnosis not present

## 2024-08-04 DIAGNOSIS — K219 Gastro-esophageal reflux disease without esophagitis: Secondary | ICD-10-CM | POA: Diagnosis not present

## 2024-08-04 DIAGNOSIS — E1169 Type 2 diabetes mellitus with other specified complication: Secondary | ICD-10-CM | POA: Diagnosis not present
